# Patient Record
Sex: Male | Born: 1950 | Race: White | Hispanic: No | Marital: Married | Smoking: Never smoker
Health system: Southern US, Community
[De-identification: ages and names within clinical notes are randomized; demographics above are authoritative.]

## PROBLEM LIST (undated history)

## (undated) DIAGNOSIS — L570 Actinic keratosis: Secondary | ICD-10-CM

## (undated) HISTORY — PX: TOTAL HIP ARTHROPLASTY: SHX124

## (undated) HISTORY — PX: DG GALL BLADDER: HXRAD326

## (undated) HISTORY — PX: HERNIA REPAIR: SHX51

## (undated) HISTORY — DX: Actinic keratosis: L57.0

## (undated) HISTORY — PX: CATARACT EXTRACTION: SUR2

---

## 2004-01-30 ENCOUNTER — Emergency Department: Payer: Self-pay | Admitting: Emergency Medicine

## 2004-01-30 ENCOUNTER — Other Ambulatory Visit: Payer: Self-pay

## 2004-02-01 ENCOUNTER — Ambulatory Visit: Payer: Self-pay | Admitting: Emergency Medicine

## 2004-02-17 ENCOUNTER — Ambulatory Visit: Payer: Self-pay | Admitting: General Surgery

## 2004-03-01 ENCOUNTER — Ambulatory Visit: Payer: Self-pay | Admitting: General Surgery

## 2006-07-25 ENCOUNTER — Ambulatory Visit: Payer: Self-pay | Admitting: Ophthalmology

## 2007-03-06 ENCOUNTER — Ambulatory Visit: Payer: Self-pay | Admitting: Ophthalmology

## 2007-06-19 ENCOUNTER — Ambulatory Visit: Payer: Self-pay | Admitting: Internal Medicine

## 2007-06-21 ENCOUNTER — Ambulatory Visit: Payer: Self-pay | Admitting: Family Medicine

## 2010-01-12 ENCOUNTER — Ambulatory Visit: Payer: Self-pay | Admitting: General Practice

## 2010-01-26 ENCOUNTER — Inpatient Hospital Stay: Payer: Self-pay | Admitting: General Practice

## 2010-01-27 LAB — PATHOLOGY REPORT

## 2010-04-14 ENCOUNTER — Ambulatory Visit: Payer: Self-pay

## 2010-05-03 ENCOUNTER — Ambulatory Visit: Payer: Self-pay | Admitting: Orthopedic Surgery

## 2011-04-07 ENCOUNTER — Ambulatory Visit: Payer: Self-pay | Admitting: Unknown Physician Specialty

## 2012-03-20 IMAGING — CR DG HIP COMPLETE 2+V*L*
1 series · 2 of 2 positions shown · non-contrast
Comparison: none

REASON FOR EXAM: s/p THA
COMMENTS:   Bedside (portable):Y

PROCEDURE:     DXR - DXR HIP LEFT COMPLETE  - January 26, 2010 [DATE]
RESULT:     The patient is status post left hip replacement. No fracture
about the femoral or acetabular prosthetic components is seen. There is no
dislocation at the prosthetic hip joint.

[Series 1: view not recorded · 0.17mm/px · 2 of 2 slices shown]
[im 1/2]
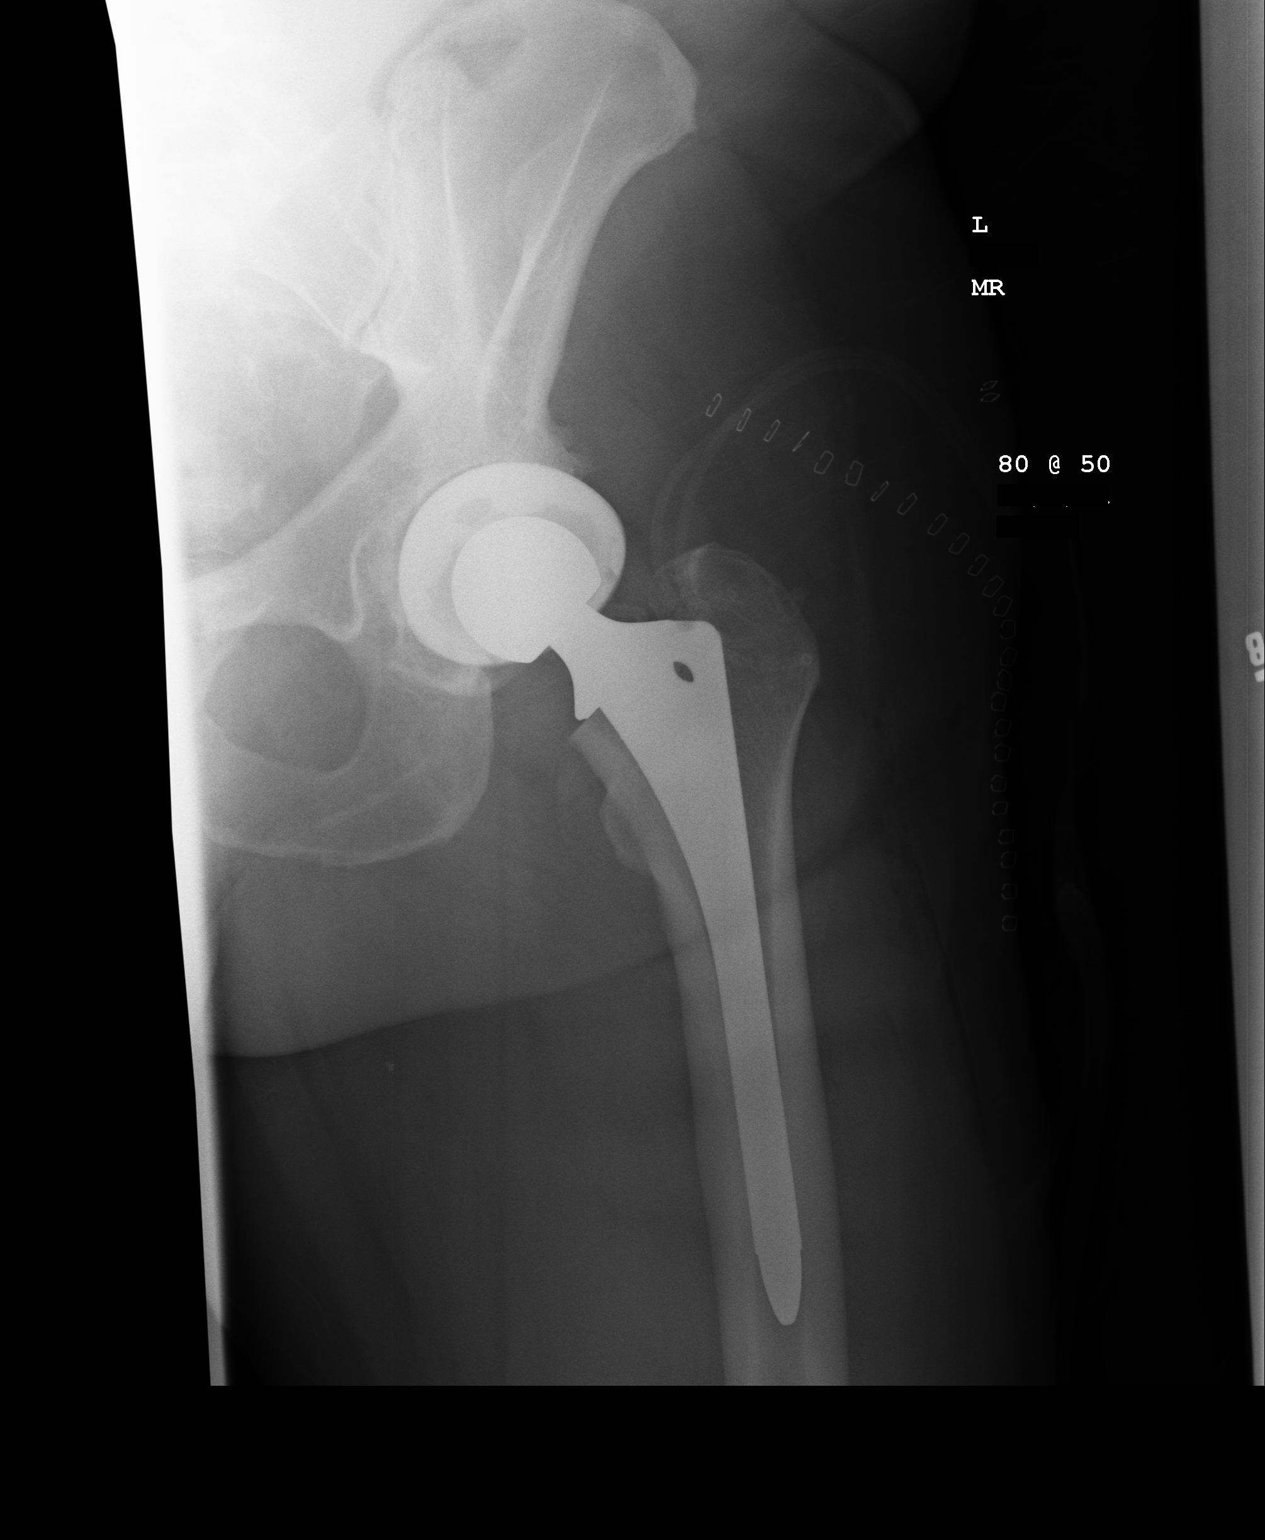
[im 2/2]
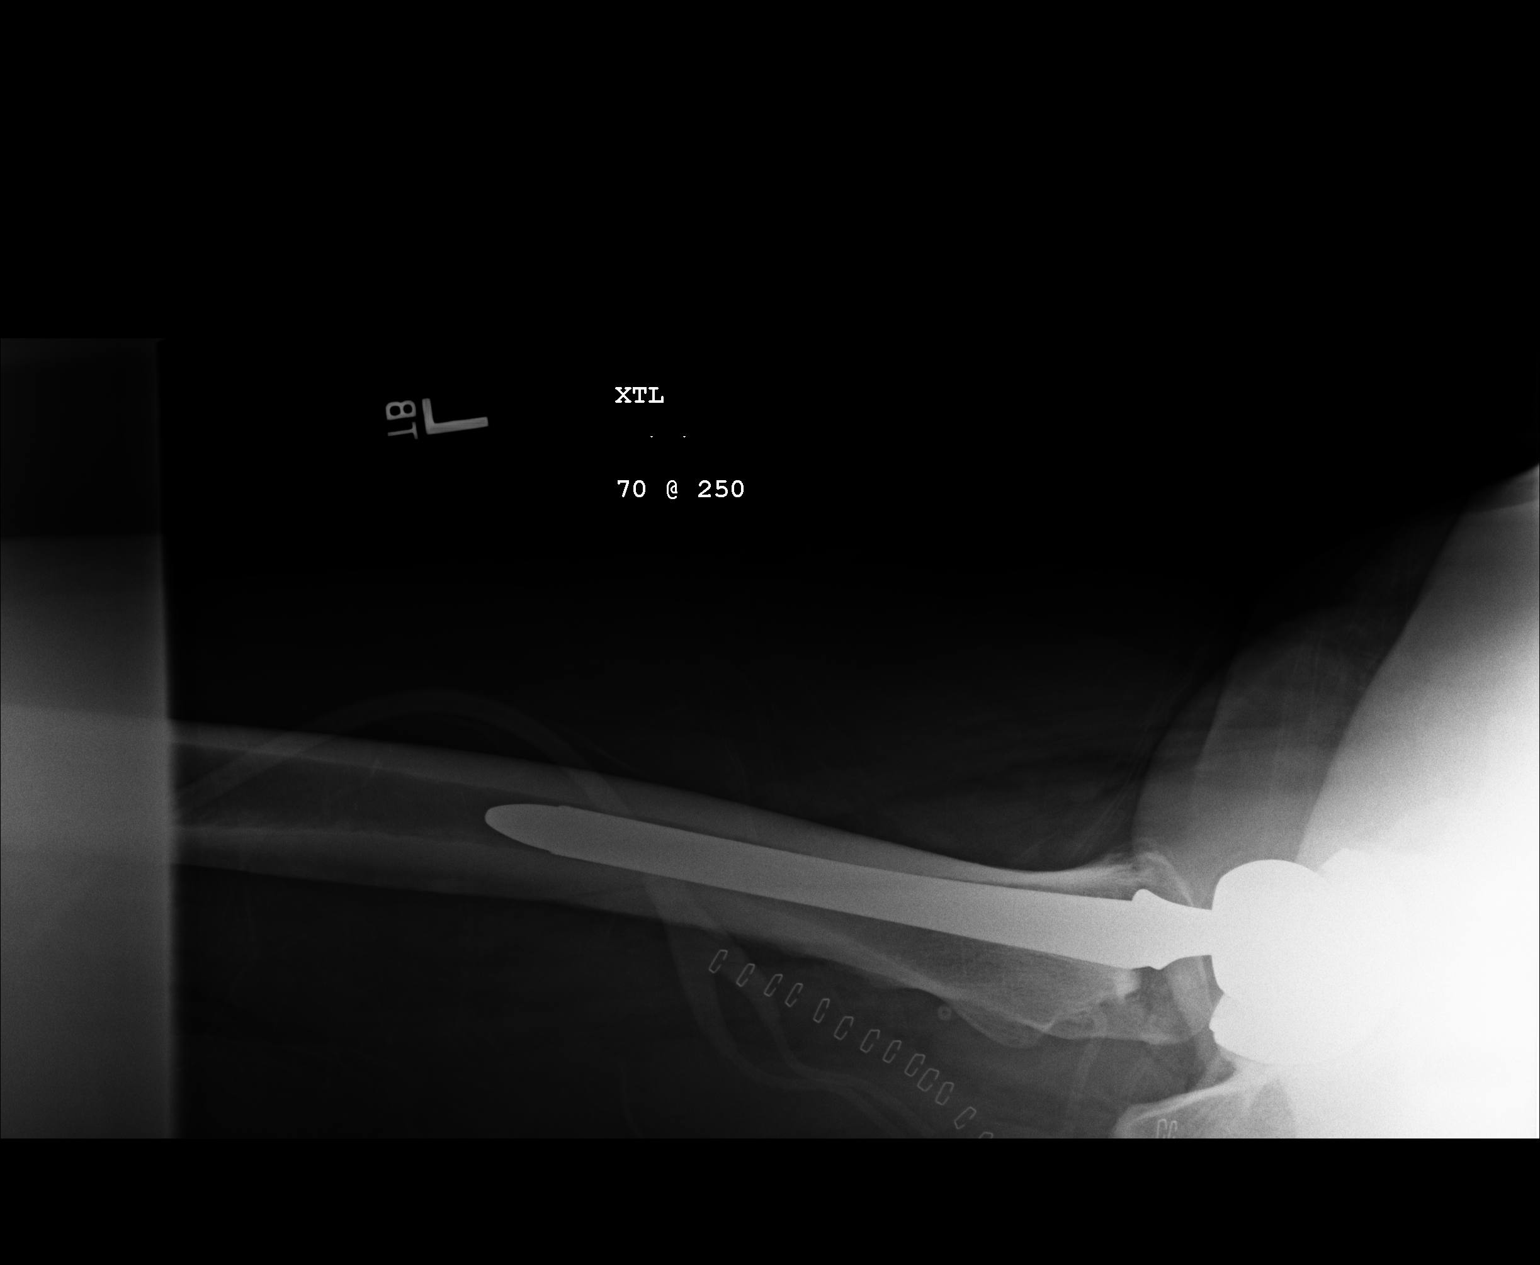

[2 of 2 positions shown; findings below may reference images not displayed]

IMPRESSION: 1.     The patient is status post left hip replacement. No abnormal
postoperative changes are identified.

## 2012-10-10 DIAGNOSIS — Z9109 Other allergy status, other than to drugs and biological substances: Secondary | ICD-10-CM | POA: Insufficient documentation

## 2014-01-19 DIAGNOSIS — Z9049 Acquired absence of other specified parts of digestive tract: Secondary | ICD-10-CM | POA: Insufficient documentation

## 2014-01-19 DIAGNOSIS — Z9089 Acquired absence of other organs: Secondary | ICD-10-CM | POA: Insufficient documentation

## 2014-02-17 ENCOUNTER — Ambulatory Visit: Payer: Self-pay | Admitting: Surgery

## 2014-02-17 LAB — CBC WITH DIFFERENTIAL/PLATELET
Basophil #: 0 10*3/uL (ref 0.0–0.1)
Basophil %: 1 %
EOS PCT: 3.1 %
Eosinophil #: 0.1 10*3/uL (ref 0.0–0.7)
HCT: 43.3 % (ref 40.0–52.0)
HGB: 14.6 g/dL (ref 13.0–18.0)
Lymphocyte #: 1.7 10*3/uL (ref 1.0–3.6)
Lymphocyte %: 37.2 %
MCH: 34 pg (ref 26.0–34.0)
MCHC: 33.8 g/dL (ref 32.0–36.0)
MCV: 101 fL — ABNORMAL HIGH (ref 80–100)
Monocyte #: 0.3 x10 3/mm (ref 0.2–1.0)
Monocyte %: 6.2 %
Neutrophil #: 2.4 10*3/uL (ref 1.4–6.5)
Neutrophil %: 52.5 %
PLATELETS: 226 10*3/uL (ref 150–440)
RBC: 4.3 10*6/uL — ABNORMAL LOW (ref 4.40–5.90)
RDW: 12.8 % (ref 11.5–14.5)
WBC: 4.6 10*3/uL (ref 3.8–10.6)

## 2014-02-17 LAB — COMPREHENSIVE METABOLIC PANEL
ALK PHOS: 70 U/L
ANION GAP: 8 (ref 7–16)
Albumin: 3.8 g/dL (ref 3.4–5.0)
BILIRUBIN TOTAL: 0.6 mg/dL (ref 0.2–1.0)
BUN: 14 mg/dL (ref 7–18)
CHLORIDE: 106 mmol/L (ref 98–107)
CO2: 28 mmol/L (ref 21–32)
Calcium, Total: 8.4 mg/dL — ABNORMAL LOW (ref 8.5–10.1)
Creatinine: 0.9 mg/dL (ref 0.60–1.30)
EGFR (African American): >60
EGFR (Non-African Amer.): >60
GLUCOSE: 104 mg/dL — AB (ref 65–99)
Osmolality: 284 (ref 275–301)
POTASSIUM: 3.7 mmol/L (ref 3.5–5.1)
SGOT(AST): 46 U/L — ABNORMAL HIGH (ref 15–37)
SGPT (ALT): 46 U/L
SODIUM: 142 mmol/L (ref 136–145)
TOTAL PROTEIN: 7.6 g/dL (ref 6.4–8.2)

## 2014-02-20 ENCOUNTER — Ambulatory Visit: Payer: Self-pay | Admitting: Surgery

## 2014-08-15 NOTE — Op Note (Signed)
PATIENT NAME:  Juan Rivera, Juan Rivera MR#:  [id number] DATE OF BIRTH:  1950/06/11  DATE OF PROCEDURE:  02/20/2014  PREOPERATIVE DIAGNOSIS: Left inguinal hernia.   POSTOPERATIVE DIAGNOSIS: Left inguinal hernia.   PROCEDURE: Left inguinal hernia repair.   SURGEON: Rochel Brome, MD  ANESTHESIA: General.   INDICATIONS: This 63 year old male reports bulging in the left groin. A left inguinal hernia was demonstrated on physical exam and repair was recommended for definitive treatment.   DESCRIPTION OF PROCEDURE: The patient was placed on the operating table in the supine position under general anesthesia. The left lower quadrant was clipped and prepared with ChloraPrep and draped in a sterile manner.   A left lower quadrant transversely oriented suprapubic incision was made, carried down through subcutaneous tissues. Numerous traversing veins were divided between 4-0 chromic suture ligatures. The Scarpa's fascia was incised. Numerous small bleeding points were cauterized. The external oblique aponeurosis was incised along the course of its fibers to open the external ring and expose the inguinal cord structures. The external oblique was retracted with a Soil scientist. The cord structures were mobilized. There was some mild weakness of the floor of the inguinal canal. The cremaster fibers were spread to expose an indirect hernia sac which was dissected free from surrounding structures. The sac was some 7 cm in length. It was opened. Its continuity with the peritoneal cavity was demonstrated. There was no bowel within the sac. A high ligation of the sac was done with a 4-0 Vicryl suture ligature. The sac was excised and was not sent for pathology. The stump was allowed to retract. Next, the floor of the inguinal canal was repaired with a row of 0 Surgilon sutures suturing the conjoined tendon to the shelving edge of the inguinal ligament incorporating transversalis fascia into the repair. The last stitch led  to satisfactory narrowing of the internal ring. Next, an onlay Bard soft mesh was cut to create an oval shape of some 2.8 x 3.5 cm. A notch was cut out to straddle the internal ring. The mesh was placed along the floor of the inguinal canal. A relaxing incision was made medially so that the mesh would overlap. The mesh was sutured to the repair with 0 Surgilon, also sutured on both sides of the internal ring and sutured medially to the medial side of the relaxing incision. Hemostasis was intact. The cut edges of the external oblique aponeurosis were closed with a running 4-0 Vicryl. The deep fascia superior and lateral to the repair site was infiltrated with 0.5% Sensorcaine with epinephrine. Subcutaneous tissues were infiltrated using a total of 15 mL. Next, the Scarpa's fascia was closed with interrupted 4-0 Vicryl. Hemostasis was intact. The skin was closed with running 4-0 Monocryl subcuticular suture and Dermabond. The testicle remained in the scrotum. The patient was prepared for transfer to the recovery room.  ____________________________ Lenna Sciara. Rochel Brome, MD jws:sb D: 02/20/2014 10:31:58 ET T: 02/20/2014 12:06:52 ET JOB#: [id number]  cc: Loreli Dollar, MD, <Dictator> Loreli Dollar MD ELECTRONICALLY SIGNED 02/20/2014 19:37

## 2014-10-18 DIAGNOSIS — Z96642 Presence of left artificial hip joint: Secondary | ICD-10-CM | POA: Insufficient documentation

## 2016-03-03 DIAGNOSIS — N529 Male erectile dysfunction, unspecified: Secondary | ICD-10-CM | POA: Insufficient documentation

## 2016-04-20 ENCOUNTER — Other Ambulatory Visit: Payer: Self-pay | Admitting: Orthopedic Surgery

## 2016-04-20 DIAGNOSIS — G8929 Other chronic pain: Secondary | ICD-10-CM

## 2016-04-20 DIAGNOSIS — M1611 Unilateral primary osteoarthritis, right hip: Secondary | ICD-10-CM

## 2016-04-20 DIAGNOSIS — M25551 Pain in right hip: Secondary | ICD-10-CM

## 2016-04-21 ENCOUNTER — Ambulatory Visit: Payer: Managed Care, Other (non HMO)

## 2016-04-22 ENCOUNTER — Ambulatory Visit: Payer: Managed Care, Other (non HMO)

## 2016-06-07 ENCOUNTER — Encounter (INDEPENDENT_AMBULATORY_CARE_PROVIDER_SITE_OTHER): Payer: Self-pay

## 2016-06-07 ENCOUNTER — Ambulatory Visit
Admission: RE | Admit: 2016-06-07 | Discharge: 2016-06-07 | Disposition: A | Discharge status: Home / Self Care | Payer: Managed Care, Other (non HMO) | Source: Ambulatory Visit | Attending: Family Medicine | Admitting: Family Medicine

## 2016-06-07 ENCOUNTER — Other Ambulatory Visit: Payer: Self-pay | Admitting: Family Medicine

## 2016-06-07 DIAGNOSIS — R05 Cough: Secondary | ICD-10-CM

## 2016-06-07 DIAGNOSIS — R059 Cough, unspecified: Secondary | ICD-10-CM

## 2016-11-08 DIAGNOSIS — N138 Other obstructive and reflux uropathy: Secondary | ICD-10-CM | POA: Insufficient documentation

## 2016-11-08 DIAGNOSIS — N3020 Other chronic cystitis without hematuria: Secondary | ICD-10-CM | POA: Insufficient documentation

## 2016-11-08 DIAGNOSIS — R339 Retention of urine, unspecified: Secondary | ICD-10-CM | POA: Insufficient documentation

## 2016-11-08 DIAGNOSIS — Q631 Lobulated, fused and horseshoe kidney: Secondary | ICD-10-CM | POA: Insufficient documentation

## 2018-07-31 IMAGING — CR DG CHEST 2V
3 series · 3 of 3 positions shown · non-contrast
Comparison: None.

CLINICAL DATA: 65 y/o  M; cough.

EXAM:
CHEST  2 VIEW

[chest pa (1 of 2)]
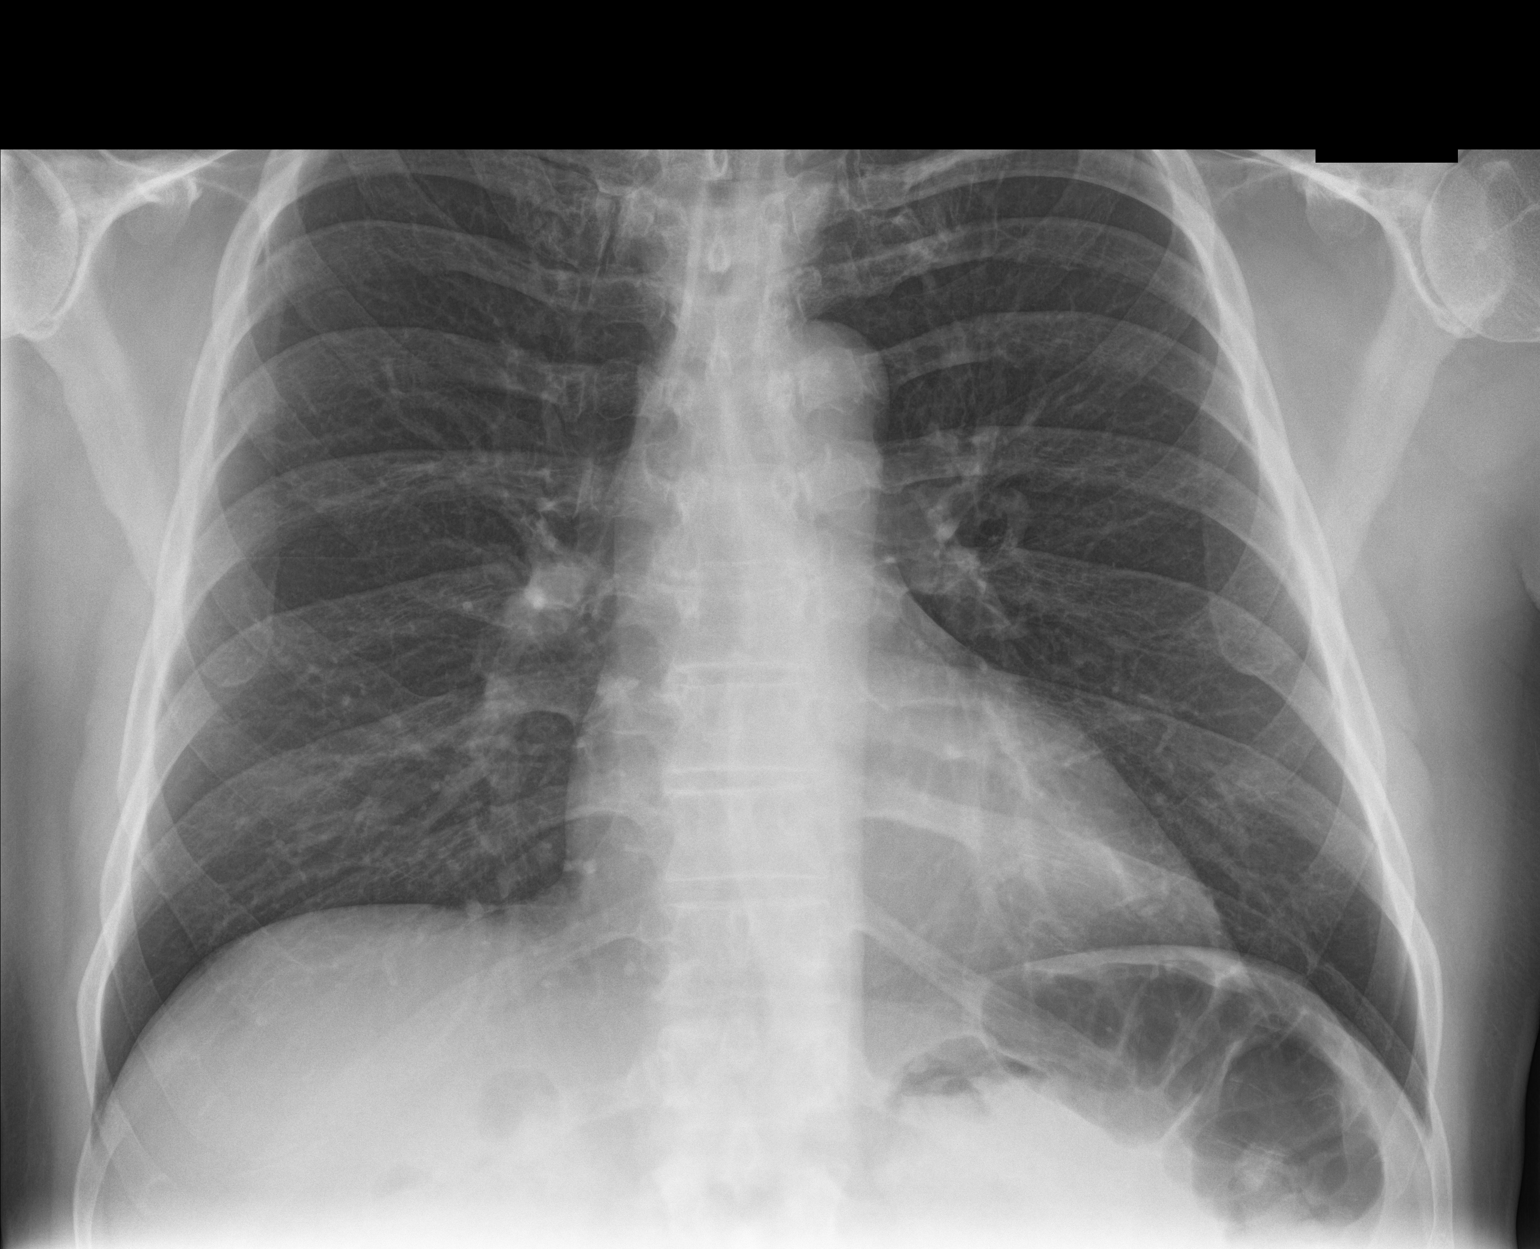

[chest lat]
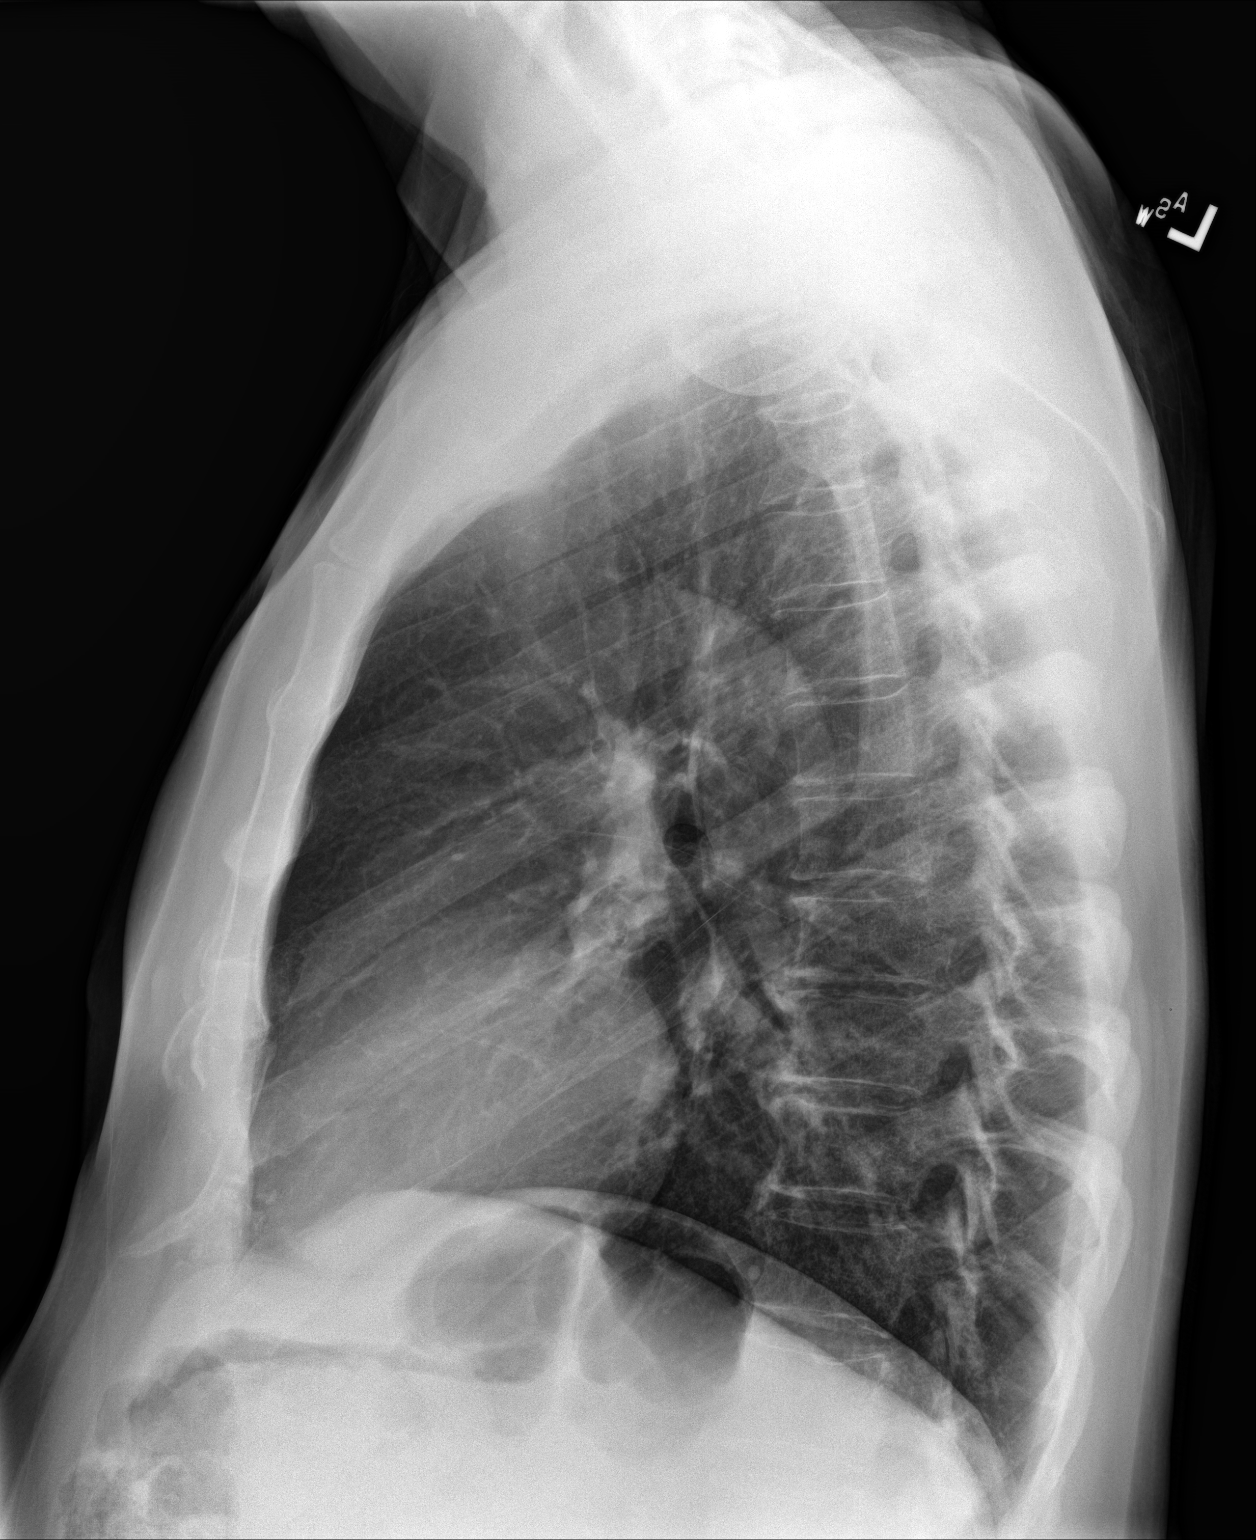

[chest pa (2 of 2)]
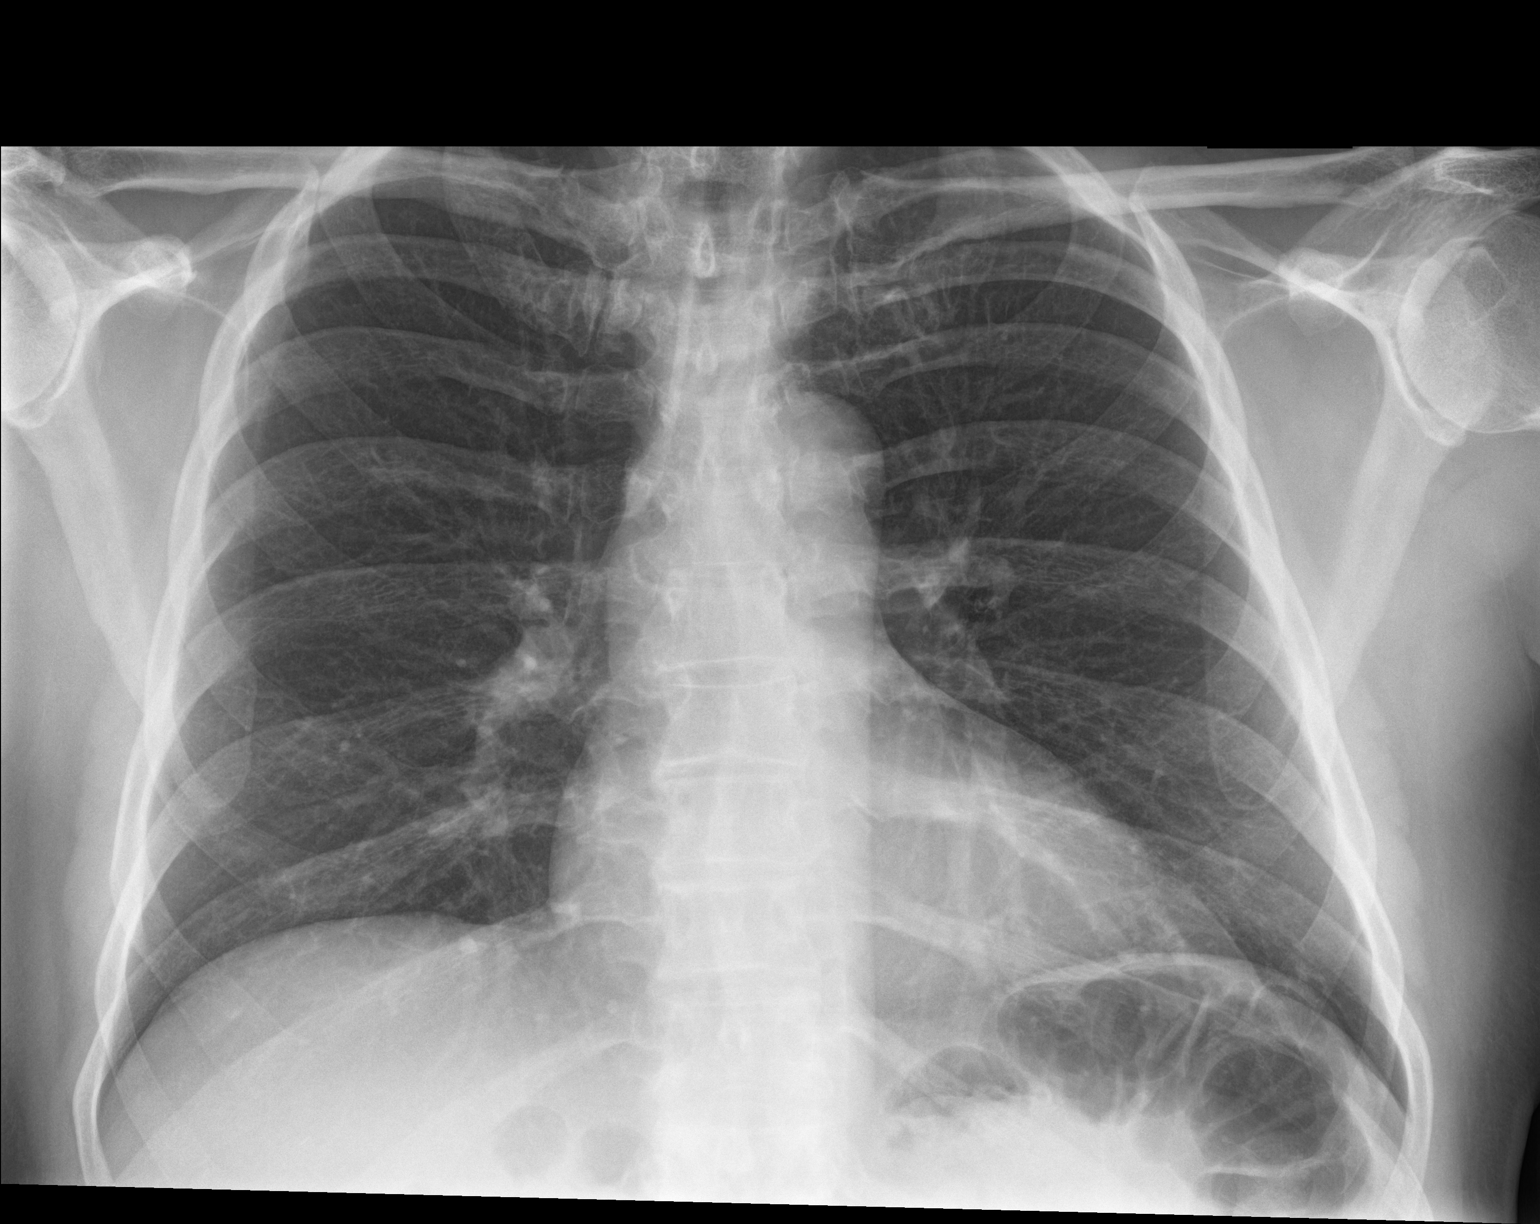

[3 of 3 positions shown; findings below may reference images not displayed]

FINDINGS: The heart size and mediastinal contours are within normal limits.
Both lungs are clear. Mild degenerative changes of the spine.
IMPRESSION: No active cardiopulmonary disease.

By: Kensho Makhloufi M.D.

## 2018-11-19 DIAGNOSIS — K4091 Unilateral inguinal hernia, without obstruction or gangrene, recurrent: Secondary | ICD-10-CM | POA: Insufficient documentation

## 2019-04-03 ENCOUNTER — Other Ambulatory Visit: Payer: Self-pay

## 2019-04-03 DIAGNOSIS — Z20822 Contact with and (suspected) exposure to covid-19: Secondary | ICD-10-CM

## 2019-04-05 LAB — NOVEL CORONAVIRUS, NAA: SARS-CoV-2, NAA: DETECTED — AB

## 2019-04-06 ENCOUNTER — Telehealth: Payer: Self-pay | Admitting: Unknown Physician Specialty

## 2019-04-06 NOTE — Telephone Encounter (Signed)
Called to discuss with patient about Covid symptoms and the use of bamlanivimab, a monoclonal antibody infusion for those with mild to moderate Covid symptoms and at a high risk of hospitalization.  Pt is qualified for this infusion at the [facility] infusion center due to Age > 65   Left message to call back

## 2019-04-07 ENCOUNTER — Telehealth: Payer: Self-pay | Admitting: Unknown Physician Specialty

## 2019-04-07 ENCOUNTER — Other Ambulatory Visit: Payer: Self-pay | Admitting: Unknown Physician Specialty

## 2019-04-07 DIAGNOSIS — U071 COVID-19: Secondary | ICD-10-CM

## 2019-04-07 NOTE — Telephone Encounter (Signed)
  I connected by phone with Juan Rivera on 04/07/2019 at 10:04 AM to discuss the potential use of an new treatment for mild to moderate COVID-19 viral infection in non-hospitalized patients.  This patient is a 68 y.o. male that meets the FDA criteria for Emergency Use Authorization of bamlanivimab or casirivimab\imdevimab.  Has a (+) direct SARS-CoV-2 viral test result  Has mild or moderate COVID-19   Is ? 68 years of age and weighs ? 40 kg  Is NOT hospitalized due to COVID-19  Is NOT requiring oxygen therapy or requiring an increase in baseline oxygen flow rate due to COVID-19  Is within 10 days of symptom onset  Has at least one of the high risk factor(s) for progression to severe COVID-19 and/or hospitalization as defined in EUA.  Specific high risk criteria : >/= 68 yo   I have spoken and communicated the following to the patient or parent/caregiver:  1. FDA has authorized the emergency use of bamlanivimab and casirivimab\imdevimab for the treatment of mild to moderate COVID-19 in adults and pediatric patients with positive results of direct SARS-CoV-2 viral testing who are 53 years of age and older weighing at least 40 kg, and who are at high risk for progressing to severe COVID-19 and/or hospitalization.  2. The significant known and potential risks and benefits of bamlanivimab and casirivimab\imdevimab, and the extent to which such potential risks and benefits are unknown.  3. Information on available alternative treatments and the risks and benefits of those alternatives, including clinical trials.  4. Patients treated with bamlanivimab and casirivimab\imdevimab should continue to self-isolate and use infection control measures (e.g., wear mask, isolate, social distance, avoid sharing personal items, clean and disinfect "high touch" surfaces, and frequent handwashing) according to CDC guidelines.   5. The patient or parent/caregiver has the option to accept or refuse  bamlanivimab or casirivimab\imdevimab .  After reviewing this information with the patient, The patient agreed to proceed with receiving the bamlanimivab infusion and will be provided a copy of the Fact sheet prior to receiving the infusion.Kathrine Haddock 04/07/2019 10:04 AM

## 2019-04-09 ENCOUNTER — Encounter (HOSPITAL_COMMUNITY): Payer: Self-pay

## 2019-04-09 ENCOUNTER — Ambulatory Visit (HOSPITAL_COMMUNITY)
Admission: RE | Admit: 2019-04-09 | Discharge: 2019-04-09 | Disposition: A | Discharge status: Home / Self Care | Payer: Medicare Other | Source: Ambulatory Visit | Attending: Pulmonary Disease | Admitting: Pulmonary Disease

## 2019-04-09 DIAGNOSIS — Z23 Encounter for immunization: Secondary | ICD-10-CM | POA: Insufficient documentation

## 2019-04-09 DIAGNOSIS — U071 COVID-19: Secondary | ICD-10-CM | POA: Diagnosis not present

## 2019-04-09 MED ORDER — EPINEPHRINE 0.3 MG/0.3ML IJ SOAJ: 0.3000 mg | Freq: Once | INTRAMUSCULAR | Status: DC | PRN

## 2019-04-09 MED ORDER — SODIUM CHLORIDE 0.9 % IV SOLN
INTRAVENOUS | Status: DC | PRN
  Administered 2019-04-09: 250 mL via INTRAVENOUS

## 2019-04-09 MED ORDER — DIPHENHYDRAMINE HCL 50 MG/ML IJ SOLN: 50.0000 mg | Freq: Once | INTRAMUSCULAR | Status: DC | PRN

## 2019-04-09 MED ORDER — FAMOTIDINE IN NACL 20-0.9 MG/50ML-% IV SOLN: 20.0000 mg | Freq: Once | INTRAVENOUS | Status: DC | PRN

## 2019-04-09 MED ORDER — ALBUTEROL SULFATE HFA 108 (90 BASE) MCG/ACT IN AERS: 2.0000 | INHALATION_SPRAY | Freq: Once | RESPIRATORY_TRACT | Status: DC | PRN

## 2019-04-09 MED ORDER — SODIUM CHLORIDE 0.9 % IV SOLN
700.0000 mg | Freq: Once | INTRAVENOUS | Status: AC
  Administered 2019-04-09: 15:00:00 700 mg via INTRAVENOUS
  Filled 2019-04-09: qty 20

## 2019-04-09 MED ORDER — METHYLPREDNISOLONE SODIUM SUCC 125 MG IJ SOLR: 125.0000 mg | Freq: Once | INTRAMUSCULAR | Status: DC | PRN

## 2019-04-09 NOTE — Progress Notes (Signed)
  Diagnosis: COVID-19  Physician: Dr. Joya Gaskins  Procedure: [facility] Med: bamlanivimab infusion - Provided patient with bamlanimivab fact sheet for patients, parents and caregivers prior to infusion.  Complications: No immediate complications noted.  Discharge: Discharged home   Juan Rivera, Juan Rivera 04/09/2019

## 2019-04-09 NOTE — Discharge Instructions (Signed)
Prevent the Spread of COVID-19 if You Are Sick If you are sick with COVID-19 or think you might have COVID-19, follow the steps below to help protect other people in your home and community. Stay home except to get medical care.  Stay home. Most people with COVID-19 have mild illness and are able to recover at home without medical care. Do not leave your home, except to get medical care. Do not visit public areas.  Take care of yourself. Get rest and stay hydrated.  Get medical care when needed. Call your doctor before you go to their office for care. But, if you have trouble breathing or other concerning symptoms, call 911 for immediate help.  Avoid public transportation, ride-sharing, or taxis. Separate yourself from other people and pets in your home.  As much as possible, stay in a specific room and away from other people and pets in your home. Also, you should use a separate bathroom, if available. If you need to be around other people or animals in or outside of the home, wear a cloth face covering. ? See COVID-19 and Animals if you have questions about pets: https://www.cdc.gov/coronavirus/2019-ncov/faq.html#COVID19animals Monitor your symptoms.  Common symptoms of COVID-19 include fever and cough. Trouble breathing is a more serious symptom that means you should get medical attention.  Follow care instructions from your healthcare provider and local health department. Your local health authorities will give instructions on checking your symptoms and reporting information. If you develop emergency warning signs for COVID-19 get medical attention immediately.  Emergency warning signs include*:  Trouble breathing  Persistent pain or pressure in the chest  New confusion or not able to be woken  Bluish lips or face *This list is not all inclusive. Please consult your medical provider for any other symptoms that are severe or concerning to you. Call 911 if you have a medical  emergency. If you have a medical emergency and need to call 911, notify the operator that you have or think you might have, COVID-19. If possible, put on a facemask before medical help arrives. Call ahead before visiting your doctor.  Call ahead. Many medical visits for routine care are being postponed or done by phone or telemedicine.  If you have a medical appointment that cannot be postponed, call your doctor's office. This will help the office protect themselves and other patients. If you are sick, wear a cloth covering over your nose and mouth.  You should wear a cloth face covering over your nose and mouth if you must be around other people or animals, including pets (even at home).  You don't need to wear the cloth face covering if you are alone. If you can't put on a cloth face covering (because of trouble breathing for example), cover your coughs and sneezes in some other way. Try to stay at least 6 feet away from other people. This will help protect the people around you. Note: During the COVID-19 pandemic, medical grade facemasks are reserved for healthcare workers and some first responders. You may need to make a cloth face covering using a scarf or bandana. Cover your coughs and sneezes.  Cover your mouth and nose with a tissue when you cough or sneeze.  Throw used tissues in a lined trash can.  Immediately wash your hands with soap and water for at least 20 seconds. If soap and water are not available, clean your hands with an alcohol-based hand sanitizer that contains at least 60% alcohol. Clean your hands often.    Wash your hands often with soap and water for at least 20 seconds. This is especially important after blowing your nose, coughing, or sneezing; going to the bathroom; and before eating or preparing food.  Use hand sanitizer if soap and water are not available. Use an alcohol-based hand sanitizer with at least 60% alcohol, covering all surfaces of your hands and rubbing  them together until they feel dry.  Soap and water are the best option, especially if your hands are visibly dirty.  Avoid touching your eyes, nose, and mouth with unwashed hands. Avoid sharing personal household items.  Do not share dishes, drinking glasses, cups, eating utensils, towels, or bedding with other people in your home.  Wash these items thoroughly after using them with soap and water or put them in the dishwasher. Clean all "high-touch" surfaces everyday.  Clean and disinfect high-touch surfaces in your "sick room" and bathroom. Let someone else clean and disinfect surfaces in common areas, but not your bedroom and bathroom.  If a caregiver or other person needs to clean and disinfect a sick person's bedroom or bathroom, they should do so on an as-needed basis. The caregiver/other person should wear a mask and wait as long as possible after the sick person has used the bathroom. High-touch surfaces include phones, remote controls, counters, tabletops, doorknobs, bathroom fixtures, toilets, keyboards, tablets, and bedside tables.  Clean and disinfect areas that may have blood, stool, or body fluids on them.  Use household cleaners and disinfectants. Clean the area or item with soap and water or another detergent if it is dirty. Then use a household disinfectant. ? Be sure to follow the instructions on the label to ensure safe and effective use of the product. Many products recommend keeping the surface wet for several minutes to ensure germs are killed. Many also recommend precautions such as wearing gloves and making sure you have good ventilation during use of the product. ? Most EPA-registered household disinfectants should be effective. How to discontinue home isolation  People with COVID-19 who have stayed home (home isolated) can stop home isolation under the following conditions: ? If you will not have a test to determine if you are still contagious, you can leave home  after these three things have happened:  You have had no fever for at least 72 hours (that is three full days of no fever without the use of medicine that reduces fevers) AND  other symptoms have improved (for example, when your cough or shortness of breath has improved) AND  at least 10 days have passed since your symptoms first appeared. ? If you will be tested to determine if you are still contagious, you can leave home after these three things have happened:  You no longer have a fever (without the use of medicine that reduces fevers) AND  other symptoms have improved (for example, when your cough or shortness of breath has improved) AND  you received two negative tests in a row, 24 hours apart. Your doctor will follow CDC guidelines. In all cases, follow the guidance of your healthcare provider and local health department. The decision to stop home isolation should be made in consultation with your healthcare provider and state and local health departments. Local decisions depend on local circumstances. cdc.gov/coronavirus 08/25/2018 This information is not intended to replace advice given to you by your health care provider. Make sure you discuss any questions you have with your health care provider. Document Released: 08/06/2018 Document Revised: 09/04/2018 Document Reviewed: 08/06/2018   Elsevier Patient Education  2020 [employer]  

## 2020-06-09 DIAGNOSIS — H9192 Unspecified hearing loss, left ear: Secondary | ICD-10-CM | POA: Insufficient documentation

## 2020-06-09 DIAGNOSIS — G8929 Other chronic pain: Secondary | ICD-10-CM | POA: Insufficient documentation

## 2020-12-08 ENCOUNTER — Ambulatory Visit: Payer: Medicare HMO | Admitting: Dermatology

## 2020-12-08 ENCOUNTER — Other Ambulatory Visit: Payer: Self-pay

## 2020-12-08 DIAGNOSIS — Z872 Personal history of diseases of the skin and subcutaneous tissue: Secondary | ICD-10-CM | POA: Diagnosis not present

## 2020-12-08 DIAGNOSIS — L219 Seborrheic dermatitis, unspecified: Secondary | ICD-10-CM

## 2020-12-08 DIAGNOSIS — D229 Melanocytic nevi, unspecified: Secondary | ICD-10-CM

## 2020-12-08 DIAGNOSIS — L570 Actinic keratosis: Secondary | ICD-10-CM | POA: Diagnosis not present

## 2020-12-08 DIAGNOSIS — L578 Other skin changes due to chronic exposure to nonionizing radiation: Secondary | ICD-10-CM | POA: Diagnosis not present

## 2020-12-08 DIAGNOSIS — Z1283 Encounter for screening for malignant neoplasm of skin: Secondary | ICD-10-CM | POA: Diagnosis not present

## 2020-12-08 DIAGNOSIS — D1800 Hemangioma unspecified site: Secondary | ICD-10-CM

## 2020-12-08 DIAGNOSIS — L814 Other melanin hyperpigmentation: Secondary | ICD-10-CM

## 2020-12-08 DIAGNOSIS — L821 Other seborrheic keratosis: Secondary | ICD-10-CM

## 2020-12-08 MED ORDER — HYDROCORTISONE 2.5 % EX CREA: TOPICAL_CREAM | Freq: Two times a day (BID) | CUTANEOUS | 11 refills | Status: DC | PRN

## 2020-12-08 MED ORDER — MOMETASONE FUROATE 0.1 % EX SOLN: CUTANEOUS | 11 refills | Status: DC

## 2020-12-08 NOTE — Progress Notes (Signed)
New Patient Visit  Subjective  Juan Rivera is a 70 y.o. male who presents for the following: Annual Exam (Mole check ). Yearly mole check hx of Aks.  The patient presents for Total-Body Skin Exam (TBSE) for skin cancer screening and mole check.   The following portions of the chart were reviewed this encounter and updated as appropriate:   Allergies  Meds  Problems  Med Hx  Surg Hx  Fam Hx     Review of Systems:  No other skin or systemic complaints except as noted in HPI or Assessment and Plan.  Objective  Well appearing patient in no apparent distress; mood and affect are within normal limits.  A full examination was performed including scalp, head, eyes, ears, nose, lips, neck, chest, axillae, abdomen, back, buttocks, bilateral upper extremities, bilateral lower extremities, hands, feet, fingers, toes, fingernails, and toenails. All findings within normal limits unless otherwise noted below.  right ear superior helix Erythematous thin papules/macules with gritty scale.   Scalp, ears Pink patches with greasy scale.    Assessment & Plan  AK (actinic keratosis) right ear superior helix  Actinic keratoses are precancerous spots that appear secondary to cumulative UV radiation exposure/sun exposure over time. They are chronic with expected duration over 1 year. A portion of actinic keratoses will progress to squamous cell carcinoma of the skin. It is not possible to reliably predict which spots will progress to skin cancer and so treatment is recommended to prevent development of skin cancer.  Recommend daily broad spectrum sunscreen SPF 30+ to sun-exposed areas, reapply every 2 hours as needed.  Recommend staying in the shade or wearing long sleeves, sun glasses (UVA+UVB protection) and wide brim hats (4-inch brim around the entire circumference of the hat). Call for new or changing lesions.   Destruction of lesion - right ear superior helix Complexity: simple    Destruction method: cryotherapy   Informed consent: discussed and consent obtained   Timeout:  patient name, date of birth, surgical site, and procedure verified Lesion destroyed using liquid nitrogen: Yes   Region frozen until ice ball extended beyond lesion: Yes   Outcome: patient tolerated procedure well with no complications   Post-procedure details: wound care instructions given    Seborrheic dermatitis Scalp, ears  Seborrheic Dermatitis  -  is a chronic persistent rash characterized by pinkness and scaling most commonly of the mid face but also can occur on the scalp (dandruff), ears; mid chest and mid back. It tends to be exacerbated by stress and cooler weather.  People who have neurologic disease may experience new onset or exacerbation of existing seborrheic dermatitis.  The condition is not curable but treatable and can be controlled.   Cont Mometasone solution apply to scalp as directed  Cont Hydrocortisone 2.5% cream apply to ears bid prn irritation   Related Medications mometasone (ELOCON) 0.1 % lotion Apply to the scalp twice a day prn itching  hydrocortisone 2.5 % cream Apply topically 2 (two) times daily as needed (Rash).  Skin cancer screening  Lentigines - Scattered tan macules - Due to sun exposure - Benign-appering, observe - Recommend daily broad spectrum sunscreen SPF 30+ to sun-exposed areas, reapply every 2 hours as needed. - Call for any changes  Seborrheic Keratoses - Stuck-on, waxy, tan-brown papules and/or plaques  - Benign-appearing - Discussed benign etiology and prognosis. - Observe - Call for any changes  Melanocytic Nevi - Tan-brown and/or pink-flesh-colored symmetric macules and papules - Benign appearing on exam  today - Observation - Call clinic for new or changing moles - Recommend daily use of broad spectrum spf 30+ sunscreen to sun-exposed areas.   Hemangiomas - Red papules - Discussed benign nature - Observe - Call for any  changes  Actinic Damage - Chronic condition, secondary to cumulative UV/sun exposure - diffuse scaly erythematous macules with underlying dyspigmentation - Recommend daily broad spectrum sunscreen SPF 30+ to sun-exposed areas, reapply every 2 hours as needed.  - Staying in the shade or wearing long sleeves, sun glasses (UVA+UVB protection) and wide brim hats (4-inch brim around the entire circumference of the hat) are also recommended for sun protection.  - Call for new or changing lesions.  Skin cancer screening performed today.   Follow-up 1 year  I, Marye Round, CMA, am acting as scribe for Sarina Ser, MD .  Documentation: I have reviewed the above documentation for accuracy and completeness, and I agree with the above.  Sarina Ser, MD

## 2020-12-08 NOTE — Patient Instructions (Addendum)

## 2020-12-12 ENCOUNTER — Encounter: Payer: Self-pay | Admitting: Dermatology

## 2021-07-11 ENCOUNTER — Other Ambulatory Visit: Payer: Self-pay

## 2021-07-11 ENCOUNTER — Ambulatory Visit
Admission: EM | Admit: 2021-07-11 | Discharge: 2021-07-11 | Disposition: A | Discharge status: Home / Self Care | Payer: Medicare HMO | Attending: Emergency Medicine | Admitting: Emergency Medicine

## 2021-07-11 DIAGNOSIS — N3000 Acute cystitis without hematuria: Secondary | ICD-10-CM | POA: Insufficient documentation

## 2021-07-11 LAB — URINALYSIS, ROUTINE W REFLEX MICROSCOPIC
Bilirubin Urine: NEGATIVE
Glucose, UA: NEGATIVE mg/dL
Ketones, ur: NEGATIVE mg/dL
Nitrite: POSITIVE — AB
Protein, ur: NEGATIVE mg/dL
Specific Gravity, Urine: 1.015 (ref 1.005–1.030)
pH: 6 (ref 5.0–8.0)

## 2021-07-11 LAB — URINALYSIS, MICROSCOPIC (REFLEX)

## 2021-07-11 MED ORDER — PHENAZOPYRIDINE HCL 200 MG PO TABS: 200.0000 mg | ORAL_TABLET | Freq: Three times a day (TID) | ORAL | 0 refills | Status: DC

## 2021-07-11 MED ORDER — CIPROFLOXACIN HCL 500 MG PO TABS: 500.0000 mg | ORAL_TABLET | Freq: Two times a day (BID) | ORAL | 0 refills | Status: AC

## 2021-07-11 NOTE — ED Provider Notes (Signed)
?[place] ? ? ? ?CSN: [id number] ?Arrival date & time: 07/11/21  1800 ? ? ?  ? ?History   ?Chief Complaint ?Chief Complaint  ?Patient presents with  ? Dysuria  ? ? ?HPI ?Juan Rivera is a 71 y.o. male.  ? ?HPI ? ?45-year-old male here for evaluation of urinary complaints. ? ?Patient reports that he developed cloudy urine and painful urination this afternoon along with some urgency and frequency of urination.  He denies any fever, abdominal pain, or low back pain.  He also denies any blood in his urine.  He was recently treated at an urgent care in [facility] on 06/29/2021 for UTI with a 7-day course of Cipro.  The culture at that time grew out E. coli.  Patient reports that the last several times she has had UTIs the only antibiotic that has been effective has been a fluoroquinolone. ? ?History reviewed. No pertinent past medical history. ? ?There are no problems to display for this patient. ? ? ?Past Surgical History:  ?Procedure Laterality Date  ? CATARACT EXTRACTION    ? DG GALL BLADDER    ? HERNIA REPAIR    ? TOTAL HIP ARTHROPLASTY    ? ? ? ? ? ?Home Medications   ? ?Prior to Admission medications   ?Medication Sig Start Date End Date Taking? Authorizing Provider  ?ciprofloxacin (CIPRO) 500 MG tablet Take 1 tablet (500 mg total) by mouth 2 (two) times daily for 10 days. 07/11/21 07/21/21 Yes Margarette Canada, NP  ?phenazopyridine (PYRIDIUM) 200 MG tablet Take 1 tablet (200 mg total) by mouth 3 (three) times daily. 07/11/21  Yes Margarette Canada, NP  ?hydrocortisone 2.5 % cream Apply topically 2 (two) times daily as needed (Rash). 12/08/20   Ralene Bathe, MD  ?mometasone (ELOCON) 0.1 % lotion Apply to the scalp twice a day prn itching 12/08/20   Ralene Bathe, MD  ? ? ?Family History ?History reviewed. No pertinent family history. ? ?Social History ?Social History  ? ?Tobacco Use  ? Smoking status: Never  ? Smokeless tobacco: Never  ?Vaping Use  ? Vaping Use: Never used  ?Substance Use Topics  ?  Alcohol use: Yes  ? Drug use: Never  ? ? ? ?Allergies   ?Almond oil, Lactose, and Peanut-containing drug products ? ? ?Review of Systems ?Review of Systems  ?Constitutional:  Negative for fever.  ?Gastrointestinal:  Negative for abdominal pain.  ?Genitourinary:  Positive for dysuria, frequency and urgency. Negative for hematuria.  ?Musculoskeletal:  Negative for back pain.  ? ? ?Physical Exam ?Triage Vital Signs ?ED Triage Vitals  ?Enc Vitals Group  ?   BP 07/11/21 1858 (!) 147/87  ?   Pulse Rate 07/11/21 1858 70  ?   Resp 07/11/21 1858 16  ?   Temp 07/11/21 1858 98.7 ?F (37.1 ?C)  ?   Temp Source 07/11/21 1858 Oral  ?   SpO2 07/11/21 1858 98 %  ?   Weight --   ?   Height --   ?   Head Circumference --   ?   Peak Flow --   ?   Pain Score 07/11/21 1856 2  ?   Pain Loc --   ?   Pain Edu? --   ?   Excl. in [place]? --   ? ?No data found. ? ?Updated Vital Signs ?BP (!) 147/87 (BP Location: Right Arm)   Pulse 70   Temp 98.7 ?F (37.1 ?C) (Oral)   Resp  16   SpO2 98%  ? ?Visual Acuity ?Right Eye Distance:   ?Left Eye Distance:   ?Bilateral Distance:   ? ?Right Eye Near:   ?Left Eye Near:    ?Bilateral Near:    ? ?Physical Exam ?Vitals and nursing note reviewed.  ?Constitutional:   ?   Appearance: Normal appearance. He is not ill-appearing.  ?HENT:  ?   Head: Normocephalic and atraumatic.  ?Cardiovascular:  ?   Rate and Rhythm: Normal rate and regular rhythm.  ?   Pulses: Normal pulses.  ?   Heart sounds: Normal heart sounds. No murmur heard. ?  No friction rub. No gallop.  ?Pulmonary:  ?   Effort: Pulmonary effort is normal.  ?   Breath sounds: Normal breath sounds. No wheezing, rhonchi or rales.  ?Abdominal:  ?   Tenderness: There is no right CVA tenderness or left CVA tenderness.  ?Skin: ?   General: Skin is warm and dry.  ?   Capillary Refill: Capillary refill takes less than 2 seconds.  ?   Findings: No erythema or rash.  ?Neurological:  ?   General: No focal deficit present.  ?   Mental Status: He is alert and oriented  to person, place, and time.  ?Psychiatric:     ?   Mood and Affect: Mood normal.     ?   Behavior: Behavior normal.     ?   Thought Content: Thought content normal.     ?   Judgment: Judgment normal.  ? ? ? ?UC Treatments / Results  ?Labs ?(all labs ordered are listed, but only abnormal results are displayed) ?Labs Reviewed  ?URINALYSIS, ROUTINE W REFLEX MICROSCOPIC - Abnormal; Notable for the following components:  ?    Result Value  ? APPearance HAZY (*)   ? Hgb urine dipstick TRACE (*)   ? Nitrite POSITIVE (*)   ? Leukocytes,Ua MODERATE (*)   ? All other components within normal limits  ?URINALYSIS, MICROSCOPIC (REFLEX) - Abnormal; Notable for the following components:  ? Bacteria, UA MANY (*)   ? All other components within normal limits  ?URINE CULTURE  ? ? ?EKG ? ? ?Radiology ?No results found. ? ?Procedures ?Procedures (including critical care time) ? ?Medications Ordered in UC ?Medications - No data to display ? ?Initial Impression / Assessment and Plan / UC Course  ?I have reviewed the triage vital signs and the nursing notes. ? ?Pertinent labs & imaging results that were available during my care of the patient were reviewed by me and considered in my medical decision making (see chart for details). ? ?Is a nontoxic-appearing 71 year old male here for evaluation of urinary complaints as outlined in HPI above.  He has a history of frequent UTIs with his most recent one being on 06/29/2021 demonstrated with a 7-day course of Cipro.  UTI symptoms returned this afternoon.  His physical exam reveals a benign cardiopulmonary exam with S1-S2 heart sounds that are regular rate and rhythm and lung sounds that are clear to auscultation all fields.  No CVA tenderness on exam.  Urine was collected at triage and is pending. ? ?UA shows hazy appearance with trace hemoglobin, nitrate positive, and moderate leukocytes.  No protein.  Microscopic shows 21-50 WBCs and many bacteria.  We will send urine for culture. ? ?Given  that patient has had recurrent UTIs and fluoroquinolones have been the antibiotic necessary to achieve a response I will discharge patient home on Cipro twice daily for 10 days.  I have also provided Pyridium to help with urinary discomfort but he states that he does not need that.  I have advised him that he can just leave it at the pharmacy then.  I have also suggested that he increase his oral fluid intake so that increases his urine production and flushes his urinary tract.  We will culture the urine and call him if we need to make any adjustments to the antibiotics based on the results. ? ? ?Final Clinical Impressions(s) / UC Diagnoses  ? ?Final diagnoses:  ?Acute cystitis without hematuria  ? ? ? ?Discharge Instructions   ? ?  ?Take the Cipro twice daily for 10 days with food for treatment of urinary tract infection. ? ?Use the Pyridium every 8 hours as needed for urinary discomfort.  This will turn your urine a bright red-orange. ? ?Increase your oral fluid intake so that you increase your urine production and or flushing your urinary system. ? ?Take an over-the-counter probiotic, such as Culturelle-Align-Activia, 1 hour after each dose of antibiotic to prevent diarrhea or yeast infections from forming. ? ?We will culture urine and change the antibiotics if necessary. ? ?Return for reevaluation, or see your primary care provider, for any new or worsening symptoms.  ? ? ? ? ?ED Prescriptions   ? ? Medication Sig Dispense Auth. Provider  ? ciprofloxacin (CIPRO) 500 MG tablet Take 1 tablet (500 mg total) by mouth 2 (two) times daily for 10 days. 20 tablet Margarette Canada, NP  ? phenazopyridine (PYRIDIUM) 200 MG tablet Take 1 tablet (200 mg total) by mouth 3 (three) times daily. 6 tablet Margarette Canada, NP  ? ?  ? ?PDMP not reviewed this encounter. ?  ?Margarette Canada, NP ?07/11/21 1925 ? ?

## 2021-07-11 NOTE — ED Triage Notes (Signed)
Patient presents to Urgent Care with complaints of possible UTI. C/o of cloudy urine and dysuria today. Has a hx of UTIs. Pt was treated with UTI 03/08. Was prescribed cipro.  ? ?Denies fever or hematuria.  ?

## 2021-07-11 NOTE — Discharge Instructions (Signed)
Take the Cipro twice daily for 10 days with food for treatment of urinary tract infection. ? ?Use the Pyridium every 8 hours as needed for urinary discomfort.  This will turn your urine a bright red-orange. ? ?Increase your oral fluid intake so that you increase your urine production and or flushing your urinary system. ? ?Take an over-the-counter probiotic, such as Culturelle-Align-Activia, 1 hour after each dose of antibiotic to prevent diarrhea or yeast infections from forming. ? ?We will culture urine and change the antibiotics if necessary. ? ?Return for reevaluation, or see your primary care provider, for any new or worsening symptoms.  ?

## 2021-07-14 LAB — URINE CULTURE: Culture: 50000 — AB

## 2022-06-01 ENCOUNTER — Ambulatory Visit: Payer: Medicare HMO | Admitting: Dermatology

## 2022-06-01 VITALS — BP 111/70

## 2022-06-01 DIAGNOSIS — L820 Inflamed seborrheic keratosis: Secondary | ICD-10-CM

## 2022-06-01 DIAGNOSIS — L219 Seborrheic dermatitis, unspecified: Secondary | ICD-10-CM | POA: Diagnosis not present

## 2022-06-01 DIAGNOSIS — L570 Actinic keratosis: Secondary | ICD-10-CM

## 2022-06-01 DIAGNOSIS — L578 Other skin changes due to chronic exposure to nonionizing radiation: Secondary | ICD-10-CM | POA: Diagnosis not present

## 2022-06-01 DIAGNOSIS — Z7189 Other specified counseling: Secondary | ICD-10-CM | POA: Diagnosis not present

## 2022-06-01 DIAGNOSIS — Z79899 Other long term (current) drug therapy: Secondary | ICD-10-CM | POA: Diagnosis not present

## 2022-06-01 MED ORDER — MOMETASONE FUROATE 0.1 % EX SOLN: CUTANEOUS | 11 refills | Status: DC

## 2022-06-01 MED ORDER — KETOCONAZOLE 2 % EX SHAM: 1.0000 | MEDICATED_SHAMPOO | CUTANEOUS | 11 refills | Status: DC

## 2022-06-01 MED ORDER — HYDROCORTISONE 2.5 % EX CREA: TOPICAL_CREAM | Freq: Two times a day (BID) | CUTANEOUS | 11 refills | Status: DC | PRN

## 2022-06-01 NOTE — Progress Notes (Signed)
Follow-Up Visit   Subjective  Juan Rivera is a 72 y.o. male who presents for the following: Other (History of AK - check sun exposed areas today per patient request). The patient has spots, moles and lesions to be evaluated, some may be new or changing and the patient has concerns that these could be cancer.  The following portions of the chart were reviewed this encounter and updated as appropriate:   Tobacco  Allergies  Meds  Problems  Med Hx  Surg Hx  Fam Hx     Review of Systems:  No other skin or systemic complaints except as noted in HPI or Assessment and Plan.  Objective  Well appearing patient in no apparent distress; mood and affect are within normal limits.  A focused examination was performed including scalp, face, arms. Relevant physical exam findings are noted in the Assessment and Plan.  Right Temple Erythematous stuck-on, waxy papule or plaque  Ears Erythematous thin papules/macules with gritty scale.    Assessment & Plan   Actinic Damage - chronic, secondary to cumulative UV radiation exposure/sun exposure over time - diffuse scaly erythematous macules with underlying dyspigmentation - Recommend daily broad spectrum sunscreen SPF 30+ to sun-exposed areas, reapply every 2 hours as needed.  - Recommend staying in the shade or wearing long sleeves, sun glasses (UVA+UVB protection) and wide brim hats (4-inch brim around the entire circumference of the hat). - Call for new or changing lesions.  Seborrheic dermatitis Scalp  Seborrheic Dermatitis  -  is a chronic persistent rash characterized by pinkness and scaling most commonly of the mid face but also can occur on the scalp (dandruff), ears; mid chest, mid back and groin.  It tends to be exacerbated by stress and cooler weather.  People who have neurologic disease may experience new onset or exacerbation of existing seborrheic dermatitis.  The condition is not curable but treatable and can be  controlled.  Continue Head and Shoulders shampoo alternate with Ketoconazole 2% shampoo Continue Mometasone lotion bid up to 5 days per week prn itch, Hydrocortisone 2.5% cream up to 3 days per week  mometasone (ELOCON) 0.1 % lotion - Scalp Apply to the scalp twice a day prn itching  hydrocortisone 2.5 % cream - Scalp Apply topically 2 (two) times daily as needed (Rash).  ketoconazole (NIZORAL) 2 % shampoo - Scalp Apply 1 Application topically 2 (two) times a week.  Inflamed seborrheic keratosis Right Temple  Destruction of lesion - Right Temple Complexity: simple   Destruction method: cryotherapy   Informed consent: discussed and consent obtained   Timeout:  patient name, date of birth, surgical site, and procedure verified Lesion destroyed using liquid nitrogen: Yes   Region frozen until ice ball extended beyond lesion: Yes   Outcome: patient tolerated procedure well with no complications   Post-procedure details: wound care instructions given    AK (actinic keratosis) Ears  Destruction of lesion - Ears Complexity: simple   Destruction method: cryotherapy   Informed consent: discussed and consent obtained   Timeout:  patient name, date of birth, surgical site, and procedure verified Lesion destroyed using liquid nitrogen: Yes   Region frozen until ice ball extended beyond lesion: Yes   Outcome: patient tolerated procedure well with no complications   Post-procedure details: wound care instructions given     Return in about 1 year (around 06/02/2023) for TBSE.  I, Ashok Cordia, CMA, am acting as scribe for Sarina Ser, MD . Documentation: I have reviewed the  above documentation for accuracy and completeness, and I agree with the above.  Sarina Ser, MD

## 2022-06-01 NOTE — Patient Instructions (Signed)
Cryotherapy Aftercare  Wash gently with soap and water everyday.   Apply Vaseline and Band-Aid daily until healed.     Due to recent changes in healthcare laws, you may see results of your pathology and/or laboratory studies on MyChart before the doctors have had a chance to review them. We understand that in some cases there may be results that are confusing or concerning to you. Please understand that not all results are received at the same time and often the doctors may need to interpret multiple results in order to provide you with the best plan of care or course of treatment. Therefore, we ask that you please give us 2 business days to thoroughly review all your results before contacting the office for clarification. Should we see a critical lab result, you will be contacted sooner.   If You Need Anything After Your Visit  If you have any questions or concerns for your doctor, please call our main line at [phone] and press option 4 to reach your doctor's medical assistant. If no one answers, please leave a voicemail as directed and we will return your call as soon as possible. Messages left after 4 pm will be answered the following business day.   You may also send us a message via MyChart. We typically respond to MyChart messages within 1-2 business days.  For prescription refills, please ask your pharmacy to contact our office. Our fax number is [phone].  If you have an urgent issue when the clinic is closed that cannot wait until the next business day, you can page your doctor at the number below.    Please note that while we do our best to be available for urgent issues outside of office hours, we are not available 24/7.   If you have an urgent issue and are unable to reach us, you may choose to seek medical care at your doctor's office, retail clinic, urgent care center, or emergency room.  If you have a medical emergency, please immediately call 911 or go to the  emergency department.  Pager Numbers  - Dr. Kowalski: [phone]  - Dr. Moye: [phone]  - Dr. Stewart: [phone]  In the event of inclement weather, please call our main line at [phone] for an update on the status of any delays or closures.  Dermatology Medication Tips: Please keep the boxes that topical medications come in in order to help keep track of the instructions about where and how to use these. Pharmacies typically print the medication instructions only on the boxes and not directly on the medication tubes.   If your medication is too expensive, please contact our office at [phone] option 4 or send us a message through MyChart.   We are unable to tell what your co-pay for medications will be in advance as this is different depending on your insurance coverage. However, we may be able to find a substitute medication at lower cost or fill out paperwork to get insurance to cover a needed medication.   If a prior authorization is required to get your medication covered by your insurance company, please allow us 1-2 business days to complete this process.  Drug prices often vary depending on where the prescription is filled and some pharmacies may offer cheaper prices.  The website www.goodrx.com contains coupons for medications through different pharmacies. The prices here do not account for what the cost may be with help from insurance (it may be cheaper with your insurance), but the website can   give you the price if you did not use any insurance.  - You can print the associated coupon and take it with your prescription to the pharmacy.  - You may also stop by our office during regular business hours and pick up a GoodRx coupon card.  - If you need your prescription sent electronically to a different pharmacy, notify our office through [facility] MyChart or by phone at [phone] option 4.     Si Usted Necesita Algo Despus de Su Visita  Tambin puede  enviarnos un mensaje a travs de MyChart. Por lo general respondemos a los mensajes de MyChart en el transcurso de 1 a 2 das hbiles.  Para renovar recetas, por favor pida a su farmacia que se ponga en contacto con nuestra oficina. Nuestro nmero de fax es el [phone].  Si tiene un asunto urgente cuando la clnica est cerrada y que no puede esperar hasta el siguiente da hbil, puede llamar/localizar a su doctor(a) al nmero que aparece a continuacin.   Por favor, tenga en cuenta que aunque hacemos todo lo posible para estar disponibles para asuntos urgentes fuera del horario de oficina, no estamos disponibles las 24 horas del da, los 7 das de la semana.   Si tiene un problema urgente y no puede comunicarse con nosotros, puede optar por buscar atencin mdica  en el consultorio de su doctor(a), en una clnica privada, en un centro de atencin urgente o en una sala de emergencias.  Si tiene una emergencia mdica, por favor llame inmediatamente al 911 o vaya a la sala de emergencias.  Nmeros de bper  - Dr. Kowalski: [phone]  - Dra. Moye: [phone]  - Dra. Stewart: [phone]  En caso de inclemencias del tiempo, por favor llame a nuestra lnea principal al [phone] para una actualizacin sobre el estado de cualquier retraso o cierre.  Consejos para la medicacin en dermatologa: Por favor, guarde las cajas en las que vienen los medicamentos de uso tpico para ayudarle a seguir las instrucciones sobre dnde y cmo usarlos. Las farmacias generalmente imprimen las instrucciones del medicamento slo en las cajas y no directamente en los tubos del medicamento.   Si su medicamento es muy caro, por favor, pngase en contacto con nuestra oficina llamando al [phone] y presione la opcin 4 o envenos un mensaje a travs de MyChart.   No podemos decirle cul ser su copago por los medicamentos por adelantado ya que esto es diferente dependiendo de la cobertura de su seguro.  Sin embargo, es posible que podamos encontrar un medicamento sustituto a menor costo o llenar un formulario para que el seguro cubra el medicamento que se considera necesario.   Si se requiere una autorizacin previa para que su compaa de seguros cubra su medicamento, por favor permtanos de 1 a 2 das hbiles para completar este proceso.  Los precios de los medicamentos varan con frecuencia dependiendo del lugar de dnde se surte la receta y alguna farmacias pueden ofrecer precios ms baratos.  El sitio web www.goodrx.com tiene cupones para medicamentos de diferentes farmacias. Los precios aqu no tienen en cuenta lo que podra costar con la ayuda del seguro (puede ser ms barato con su seguro), pero el sitio web puede darle el precio si no utiliz ningn seguro.  - Puede imprimir el cupn correspondiente y llevarlo con su receta a la farmacia.  - Tambin puede pasar por nuestra oficina durante el horario de atencin regular y recoger una tarjeta de cupones de GoodRx.  -   Si necesita que su receta se enve electrnicamente a una farmacia diferente, informe a nuestra oficina a travs de MyChart de [facility] o por telfono llamando al [phone] y presione la opcin 4.  

## 2022-06-13 ENCOUNTER — Encounter: Payer: Self-pay | Admitting: Dermatology

## 2022-06-27 ENCOUNTER — Other Ambulatory Visit: Payer: Self-pay | Admitting: Orthopedic Surgery

## 2022-06-27 DIAGNOSIS — M19012 Primary osteoarthritis, left shoulder: Secondary | ICD-10-CM

## 2022-07-10 ENCOUNTER — Ambulatory Visit
Admission: RE | Admit: 2022-07-10 | Discharge: 2022-07-10 | Disposition: A | Discharge status: Home / Self Care | Payer: Medicare HMO | Source: Ambulatory Visit | Attending: Orthopedic Surgery | Admitting: Orthopedic Surgery

## 2022-07-10 DIAGNOSIS — M19012 Primary osteoarthritis, left shoulder: Secondary | ICD-10-CM

## 2023-03-09 DIAGNOSIS — M1711 Unilateral primary osteoarthritis, right knee: Secondary | ICD-10-CM | POA: Insufficient documentation

## 2023-06-07 ENCOUNTER — Ambulatory Visit: Payer: Medicare HMO | Admitting: Dermatology

## 2023-06-07 DIAGNOSIS — M12571 Traumatic arthropathy, right ankle and foot: Secondary | ICD-10-CM | POA: Insufficient documentation

## 2023-06-13 ENCOUNTER — Other Ambulatory Visit: Payer: Self-pay | Admitting: Dermatology

## 2023-06-13 DIAGNOSIS — L219 Seborrheic dermatitis, unspecified: Secondary | ICD-10-CM

## 2023-06-18 ENCOUNTER — Ambulatory Visit: Payer: Medicare HMO | Admitting: Dermatology

## 2023-06-18 ENCOUNTER — Encounter: Payer: Self-pay | Admitting: Dermatology

## 2023-06-18 DIAGNOSIS — L219 Seborrheic dermatitis, unspecified: Secondary | ICD-10-CM

## 2023-06-18 DIAGNOSIS — L820 Inflamed seborrheic keratosis: Secondary | ICD-10-CM

## 2023-06-18 DIAGNOSIS — L570 Actinic keratosis: Secondary | ICD-10-CM | POA: Diagnosis not present

## 2023-06-18 DIAGNOSIS — D1801 Hemangioma of skin and subcutaneous tissue: Secondary | ICD-10-CM

## 2023-06-18 DIAGNOSIS — L821 Other seborrheic keratosis: Secondary | ICD-10-CM

## 2023-06-18 DIAGNOSIS — Z1283 Encounter for screening for malignant neoplasm of skin: Secondary | ICD-10-CM | POA: Diagnosis not present

## 2023-06-18 DIAGNOSIS — L814 Other melanin hyperpigmentation: Secondary | ICD-10-CM

## 2023-06-18 DIAGNOSIS — L578 Other skin changes due to chronic exposure to nonionizing radiation: Secondary | ICD-10-CM | POA: Diagnosis not present

## 2023-06-18 DIAGNOSIS — Z7189 Other specified counseling: Secondary | ICD-10-CM

## 2023-06-18 DIAGNOSIS — Z5111 Encounter for antineoplastic chemotherapy: Secondary | ICD-10-CM

## 2023-06-18 DIAGNOSIS — Z79899 Other long term (current) drug therapy: Secondary | ICD-10-CM

## 2023-06-18 DIAGNOSIS — W908XXA Exposure to other nonionizing radiation, initial encounter: Secondary | ICD-10-CM | POA: Diagnosis not present

## 2023-06-18 DIAGNOSIS — L603 Nail dystrophy: Secondary | ICD-10-CM

## 2023-06-18 DIAGNOSIS — L918 Other hypertrophic disorders of the skin: Secondary | ICD-10-CM

## 2023-06-18 DIAGNOSIS — L299 Pruritus, unspecified: Secondary | ICD-10-CM

## 2023-06-18 DIAGNOSIS — D229 Melanocytic nevi, unspecified: Secondary | ICD-10-CM

## 2023-06-18 MED ORDER — FLUOROURACIL 5 % EX CREA: TOPICAL_CREAM | CUTANEOUS | 0 refills | Status: AC

## 2023-06-18 MED ORDER — MOMETASONE FUROATE 0.1 % EX SOLN: CUTANEOUS | 11 refills | Status: AC

## 2023-06-18 NOTE — Patient Instructions (Addendum)
Instructions for [facility] Medications  One or more of your medications was sent to the [facility] mail order compounding pharmacy. You will receive an email from them and can purchase the medicine through that link. It will then be mailed to your home at the address you confirmed. If for any reason you do not receive an email from them, please check your spam folder. If you still do not find the email, please let us know. [facility] phone number is [phone].  5-Fluorouracil/Calcipotriene Patient Education   Actinic keratoses are the dry, red scaly spots on the skin caused by sun damage. A portion of these spots can turn into skin cancer with time, and treating them can help prevent development of skin cancer.   Treatment of these spots requires removal of the defective skin cells. There are various ways to remove actinic keratoses, including freezing with liquid nitrogen, treatment with creams, or treatment with a blue light procedure in the office.   5-fluorouracil cream is a topical cream used to treat actinic keratoses. It works by interfering with the growth of abnormal fast-growing skin cells, such as actinic keratoses. These cells peel off and are replaced by healthy ones. THIS CREAM SHOULD BE KEPT OUT OF REACH OF CHILDREN AND PETS AND SHOULD NOT BE USED BY PREGNANT WOMEN.  5-fluorouracil/calcipotriene is a combination of the 5-fluorouracil cream with a vitamin D analog cream called calcipotriene. The calcipotriene alone does not treat actinic keratoses. However, when it is combined with 5-fluorouracil, it helps the 5-fluorouracil treat the actinic keratoses much faster so that the same results can be achieved with a much shorter treatment time.  INSTRUCTIONS FOR 5-FLUOROURACIL/CALCIPOTRIENE CREAM:   5-fluorouracil/calcipotriene cream typically only needs to be used for 4-7 days. A thin layer should be applied twice a day to the treatment areas recommended by your  physician.   If your physician prescribed you separate tubes of 5-fluourouracil and calcipotriene, apply a thin layer of 5-fluorouracil followed by a thin layer of calcipotriene.   Avoid contact with your eyes or nostrils. Avoid applying the cream to your eyelids or lips unless directed to apply there by your physician. Do not use 5-fluorouracil/calcipotriene cream on infected or open wounds.   You will develop redness, irritation and some crusting at areas where you have pre-cancer damage/actinic keratoses. IF YOU DEVELOP PAIN, BLEEDING, OR SIGNIFICANT CRUSTING, STOP THE TREATMENT EARLY - you have already gotten a good response and the actinic keratoses should clear up well.  Wash your hands after applying 5-fluorouracil 5% cream on your skin.   A moisturizer or sunscreen with a minimum SPF 30 should be applied each morning.   Once you have finished the treatment, you can apply a thin layer of Vaseline twice a day to irritated areas to soothe and calm the areas more quickly. If you experience significant discomfort, contact your physician.  For some patients it is necessary to repeat the treatment for best results.  SIDE EFFECTS: When using 5-fluorouracil/calcipotriene cream, you may have mild irritation, such as redness, dryness, swelling, or a mild burning sensation. This usually resolves within 2 weeks. The more actinic keratoses you have, the more redness and inflammation you can expect during treatment. Eye irritation has been reported rarely. If this occurs, please let us know.   If you have any trouble using this cream, please send [place] a MyChart message or call the office. If you have any other questions about this information, please do not hesitate to ask me before  you leave the office or contact me on MyChart or by phone.    Due to recent changes in healthcare laws, you may see results of your pathology and/or laboratory studies on MyChart before the doctors have had a chance to  review them. We understand that in some cases there may be results that are confusing or concerning to you. Please understand that not all results are received at the same time and often the doctors may need to interpret multiple results in order to provide you with the best plan of care or course of treatment. Therefore, we ask that you please give [place] 2 business days to thoroughly review all your results before contacting the office for clarification. Should we see a critical lab result, you will be contacted sooner.   If You Need Anything After Your Visit  If you have any questions or concerns for your doctor, please call our main line at [phone] and press option 4 to reach your doctor's medical assistant. If no one answers, please leave a voicemail as directed and we will return your call as soon as possible. Messages left after 4 pm will be answered the following business day.   You may also send [place] a message via MyChart. We typically respond to MyChart messages within 1-2 business days.  For prescription refills, please ask your pharmacy to contact our office. Our fax number is [phone].  If you have an urgent issue when the clinic is closed that cannot wait until the next business day, you can page your doctor at the number below.    Please note that while we do our best to be available for urgent issues outside of office hours, we are not available 24/7.   If you have an urgent issue and are unable to reach [place], you may choose to seek medical care at your doctor's office, retail clinic, urgent care center, or emergency room.  If you have a medical emergency, please immediately call 911 or go to the emergency department.  Pager Numbers  - Dr. Gwen Pounds: [phone]  - Dr. Roseanne Reno: [phone]  - Dr. Katrinka Blazing: [phone]   In the event of inclement weather, please call our main line at [phone] for an update on the status of any delays or closures.  Dermatology  Medication Tips: Please keep the boxes that topical medications come in in order to help keep track of the instructions about where and how to use these. Pharmacies typically print the medication instructions only on the boxes and not directly on the medication tubes.   If your medication is too expensive, please contact our office at [phone] option 4 or send [place] a message through MyChart.   We are unable to tell what your co-pay for medications will be in advance as this is different depending on your insurance coverage. However, we may be able to find a substitute medication at lower cost or fill out paperwork to get insurance to cover a needed medication.   If a prior authorization is required to get your medication covered by your insurance company, please allow [place] 1-2 business days to complete this process.  Drug prices often vary depending on where the prescription is filled and some pharmacies may offer cheaper prices.  The website www.goodrx.com contains coupons for medications through different pharmacies. The prices here do not account for what the cost may be with help from insurance (it may be cheaper with your insurance), but the website can give you the price if you  did not use any insurance.  - You can print the associated coupon and take it with your prescription to the pharmacy.  - You may also stop by our office during regular business hours and pick up a GoodRx coupon card.  - If you need your prescription sent electronically to a different pharmacy, notify our office through [facility] or by phone at [phone] option 4.     Si Usted Necesita Algo Despus de Su Visita  Tambin puede enviarnos un mensaje a travs de Clinical cytogeneticist. Por lo general respondemos a los mensajes de MyChart en el transcurso de 1 a 2 das hbiles.  Para renovar recetas, por favor pida a su farmacia que se ponga en contacto con nuestra oficina. Annie Sable de fax es [place] [phone].  Si  tiene un asunto urgente cuando la clnica est cerrada y que no puede esperar hasta el siguiente da hbil, puede llamar/localizar a su doctor(a) al nmero que aparece a continuacin.   Por favor, tenga en cuenta que aunque hacemos todo lo posible para estar disponibles para asuntos urgentes fuera del horario de [place], no estamos disponibles las 24 horas del da, los 7 [address]  [address] de la [place].   Si tiene un problema urgente y no puede comunicarse con nosotros, puede optar por buscar atencin mdica  en el consultorio de su doctor(a), en una clnica privada, en un centro de atencin urgente o en una sala de emergencias.  Si tiene Engineer, drilling, por favor llame inmediatamente al 911 o vaya a la sala de emergencias.  Nmeros de bper  - Dr. Gwen Pounds: [phone]  - Dra. Roseanne Reno: [phone]  - Dr. Katrinka Blazing: [phone]   En caso de inclemencias del tiempo, por favor llame a Lacy Duverney principal al [phone] para una actualizacin sobre el [place] de cualquier retraso o cierre.  Consejos para la medicacin en dermatologa: Por favor, guarde las cajas en las que vienen los medicamentos de uso tpico para ayudarle a seguir las instrucciones sobre dnde y cmo usarlos. Las farmacias generalmente imprimen las instrucciones del medicamento slo en las cajas y no directamente en los tubos del [place].   Si su medicamento es muy caro, por favor, pngase en contacto con Rolm Gala llamando al [phone] y presione la opcin 4 o envenos un mensaje a travs de Clinical cytogeneticist.   No podemos decirle cul ser su copago por los medicamentos por adelantado ya que esto es diferente dependiendo de la cobertura de su seguro. Sin embargo, es posible que podamos encontrar un medicamento sustituto a Audiological scientist un formulario para que el seguro cubra el medicamento que se considera necesario.   Si se requiere una autorizacin previa para que su compaa de seguros [place] su medicamento, por  favor permtanos de 1 a 2 das hbiles para completar [address]  Los precios de los medicamentos varan con frecuencia dependiendo del Environmental consultant de dnde se surte la receta y alguna farmacias pueden ofrecer precios ms baratos.  El sitio web www.goodrx.com tiene cupones para medicamentos de Health and safety inspector. Los precios aqu no tienen en cuenta lo que podra costar con la ayuda del seguro (puede ser ms barato con su seguro), pero el sitio web puede darle el precio si no utiliz Tourist information centre manager.  - Puede imprimir el cupn correspondiente y llevarlo con su receta a la farmacia.  - Tambin puede pasar por nuestra oficina durante el horario de atencin regular y Education officer, museum una tarjeta de cupones de GoodRx.  - Si necesita que su receta  se enve electrnicamente a una farmacia diferente, informe a nuestra oficina a travs de MyChart de [place] o por telfono llamando al [phone] y presione la opcin 4.

## 2023-06-18 NOTE — Progress Notes (Signed)
 Follow-Up Visit   Subjective  Juan Rivera is a 73 y.o. male who presents for the following: Skin Cancer Screening and Upper Body Skin Exam  The patient presents for Upper Body Skin Exam (UBSE) for skin cancer screening and mole check. The patient has spots, moles and lesions to be evaluated, some may be new or changing and the patient may have concern these could be cancer.  Patient has a rash on lower legs and L upper arm that he is concerned may be "ring worm". Patient has tried HC cream, Lamisil, and Lotrimin cream. Lotrimin cream has helped improve patches. Pt concerned about fungus on the toenails and he would like to discuss treatment options.  The following portions of the chart were reviewed this encounter and updated as appropriate: medications, allergies, medical history  Review of Systems:  No other skin or systemic complaints except as noted in HPI or Assessment and Plan.  Objective  Well appearing patient in no apparent distress; mood and affect are within normal limits.  All skin waist up, the lower legs, feet, and toenails. Relevant physical exam findings are noted in the Assessment and Plan.  L ear x 1 Erythematous thin papules/macules with gritty scale.  Back x 2, L mid lat tricep x 2, R mid calf x 1, R lat neck x 1, L lat neck x 2 (8) Erythematous stuck-on, waxy papule or plaque  Assessment & Plan   AK (ACTINIC KERATOSIS) L ear x 1 ACTINIC DAMAGE WITH PRECANCEROUS ACTINIC KERATOSES Counseling for Topical Chemotherapy Management: Patient exhibits: - Severe, confluent actinic changes with pre-cancerous actinic keratoses that is secondary to cumulative UV radiation exposure over time - Condition that is severe; chronic, not at goal. - diffuse scaly erythematous macules and papules with underlying dyspigmentation - Discussed Prescription "Field Treatment" topical Chemotherapy for Severe, Chronic Confluent Actinic Changes with Pre-Cancerous Actinic  Keratoses Field treatment involves treatment of an entire area of skin that has confluent Actinic Changes (Sun/ Ultraviolet light damage) and PreCancerous Actinic Keratoses by method of PhotoDynamic Therapy (PDT) and/or prescription Topical Chemotherapy agents such as 5-fluorouracil, 5-fluorouracil/calcipotriene, and/or imiquimod.  The purpose is to decrease the number of clinically evident and subclinical PreCancerous lesions to prevent progression to development of skin cancer by chemically destroying early precancer changes that may or may not be visible.  It has been shown to reduce the risk of developing skin cancer in the treated area. As a result of treatment, redness, scaling, crusting, and open sores may occur during treatment course. One or more than one of these methods may be used and may have to be used several times to control, suppress and eliminate the PreCancerous changes. Discussed treatment course, expected reaction, and possible side effects. - Recommend daily broad spectrum sunscreen SPF 30+ to sun-exposed areas, reapply every 2 hours as needed.  - Staying in the shade or wearing long sleeves, sun glasses (UVA+UVB protection) and wide brim hats (4-inch brim around the entire circumference of the hat) are also recommended. - Call for new or changing lesions.  Start 5-FU/calcipotriene cream twice daily for 7 days to affected area left ear starting in 1 month  Actinic keratoses are precancerous spots that appear secondary to cumulative UV radiation exposure/sun exposure over time. They are chronic with expected duration over 1 year. A portion of actinic keratoses will progress to squamous cell carcinoma of the skin. It is not possible to reliably predict which spots will progress to skin cancer and so treatment is recommended  to prevent development of skin cancer.  Recommend daily broad spectrum sunscreen SPF 30+ to sun-exposed areas, reapply every 2 hours as needed.  Recommend staying  in the shade or wearing long sleeves, sun glasses (UVA+UVB protection) and wide brim hats (4-inch brim around the entire circumference of the hat). Call for new or changing lesions. - Start Fluorouracil mix in one month apply to aa L ear BID x 1 week.  Destruction of lesion - L ear x 1 Complexity: simple   Destruction method: cryotherapy   Informed consent: discussed and consent obtained   Timeout:  patient name, date of birth, surgical site, and procedure verified Lesion destroyed using liquid nitrogen: Yes   Region frozen until ice ball extended beyond lesion: Yes   Outcome: patient tolerated procedure well with no complications   Post-procedure details: wound care instructions given   INFLAMED SEBORRHEIC KERATOSIS (8) Back x 2, L mid lat tricep x 2, R mid calf x 1, R lat neck x 1, L lat neck x 2 (8) Symptomatic, irritating, patient would like treated.  Destruction of lesion - Back x 2, L mid lat tricep x 2, R mid calf x 1, R lat neck x 1, L lat neck x 2 (8) Complexity: simple   Destruction method: cryotherapy   Informed consent: discussed and consent obtained   Timeout:  patient name, date of birth, surgical site, and procedure verified Lesion destroyed using liquid nitrogen: Yes   Region frozen until ice ball extended beyond lesion: Yes   Outcome: patient tolerated procedure well with no complications   Post-procedure details: wound care instructions given   Skin cancer screening performed today.  Actinic Damage - Chronic condition, secondary to cumulative UV/sun exposure - diffuse scaly erythematous macules with underlying dyspigmentation - Recommend daily broad spectrum sunscreen SPF 30+ to sun-exposed areas, reapply every 2 hours as needed.  - Staying in the shade or wearing long sleeves, sun glasses (UVA+UVB protection) and wide brim hats (4-inch brim around the entire circumference of the hat) are also recommended for sun protection.  - Call for new or changing  lesions.  Lentigines, Seborrheic Keratoses, Hemangiomas - Benign normal skin lesions - Benign-appearing - Call for any changes  Melanocytic Nevi - Tan-brown and/or pink-flesh-colored symmetric macules and papules - Benign appearing on exam today - Observation - Call clinic for new or changing moles - Recommend daily use of broad spectrum spf 30+ sunscreen to sun-exposed areas.   Seborrheic dermatitis With Pruritus Scalp - clear today Seborrheic Dermatitis  -  is a chronic persistent rash characterized by pinkness and scaling most commonly of the mid face but also can occur on the scalp (dandruff), ears; mid chest, mid back and groin.  It tends to be exacerbated by stress and cooler weather.  People who have neurologic disease may experience new onset or exacerbation of existing seborrheic dermatitis.  The condition is not curable but treatable and can be controlled.  Chronic and persistent condition with duration or expected duration over one year. Condition is improving with treatment but not currently at goal. Still some itching.  Continue Head and Shoulders shampoo alternate with Ketoconazole 2% shampoo Continue Mometasone lotion bid up to 5 days per week prn itch, Hydrocortisone 2.5% cream up to 3 days per week  Continue Ketoconazole 2% shampoo alternating with Head and Shoulders every other night. Continue HC 2.5% cream to aa's every other day PRN - may use up to 5 days per week.  Acrochordons (Skin Tags) - axillary -  Fleshy, skin-colored pedunculated papules - Benign appearing.  - Observe. - If desired, they can be removed with an in office procedure that is not covered by insurance. - Please call the clinic if you notice any new or changing lesions.  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 Consider molecular testing if patient continues to have  issues.  If worsens, will re-evaluate and consider Molecular studies.  Hx torn prick reaction with pustules from plan stickers in past (now resolved) -  Recommend antibiotic ointment to infected areas until healed.  Make sure get medical attention for problem if not improving or if worsening.  Return in about 1 year (around 06/17/2024) for TBSE.  Maylene Roes, CMA, am acting as scribe for Armida Sans, MD .   Documentation: I have reviewed the above documentation for accuracy and completeness, and I agree with the above.  Armida Sans, MD

## 2023-06-21 ENCOUNTER — Ambulatory Visit: Payer: Medicare HMO | Admitting: Dermatology

## 2023-07-19 ENCOUNTER — Encounter: Payer: Self-pay | Admitting: Dermatology

## 2023-10-17 DIAGNOSIS — R600 Localized edema: Secondary | ICD-10-CM | POA: Insufficient documentation

## 2023-10-18 DIAGNOSIS — S9301XA Subluxation of right ankle joint, initial encounter: Secondary | ICD-10-CM | POA: Insufficient documentation

## 2023-10-18 DIAGNOSIS — I493 Ventricular premature depolarization: Secondary | ICD-10-CM | POA: Insufficient documentation

## 2023-10-24 ENCOUNTER — Other Ambulatory Visit: Payer: Self-pay | Admitting: Dermatology

## 2023-10-24 DIAGNOSIS — L219 Seborrheic dermatitis, unspecified: Secondary | ICD-10-CM

## 2023-11-27 ENCOUNTER — Ambulatory Visit: Admitting: Dermatology

## 2023-12-10 ENCOUNTER — Encounter: Payer: Self-pay | Admitting: Dermatology

## 2023-12-10 ENCOUNTER — Ambulatory Visit: Admitting: Dermatology

## 2023-12-10 DIAGNOSIS — W908XXA Exposure to other nonionizing radiation, initial encounter: Secondary | ICD-10-CM

## 2023-12-10 DIAGNOSIS — Z1283 Encounter for screening for malignant neoplasm of skin: Secondary | ICD-10-CM | POA: Diagnosis not present

## 2023-12-10 DIAGNOSIS — L570 Actinic keratosis: Secondary | ICD-10-CM

## 2023-12-10 DIAGNOSIS — D1801 Hemangioma of skin and subcutaneous tissue: Secondary | ICD-10-CM

## 2023-12-10 DIAGNOSIS — L578 Other skin changes due to chronic exposure to nonionizing radiation: Secondary | ICD-10-CM | POA: Diagnosis not present

## 2023-12-10 DIAGNOSIS — L814 Other melanin hyperpigmentation: Secondary | ICD-10-CM | POA: Diagnosis not present

## 2023-12-10 DIAGNOSIS — L853 Xerosis cutis: Secondary | ICD-10-CM

## 2023-12-10 DIAGNOSIS — L821 Other seborrheic keratosis: Secondary | ICD-10-CM

## 2023-12-10 DIAGNOSIS — D229 Melanocytic nevi, unspecified: Secondary | ICD-10-CM

## 2023-12-10 NOTE — Progress Notes (Signed)
   Follow-Up Visit   Subjective  Juan Rivera is a 73 y.o. male who presents for the following: Skin Cancer Screening and Upper Body Skin Exam hx of Aks, pt did use 5FU/Calcipotriene to bil ears with good reaction, itching on and off lower legs for yrs, dry irritated spot penis, has tried Cerave cream and it burns area  The patient presents for Upper Body Skin Exam (UBSE) for skin cancer screening and mole check. The patient has spots, moles and lesions to be evaluated, some may be new or changing and the patient may have concern these could be cancer.    The following portions of the chart were reviewed this encounter and updated as appropriate: medications, allergies, medical history  Review of Systems:  No other skin or systemic complaints except as noted in HPI or Assessment and Plan.  Objective  Well appearing patient in no apparent distress; mood and affect are within normal limits.  All skin waist up examined. Relevant physical exam findings are noted in the Assessment and Plan.    Assessment & Plan   MULTIPLE BENIGN NEVI   LENTIGINES   ACTINIC ELASTOSIS   SEBORRHEIC KERATOSES   CHERRY ANGIOMA   XEROSIS CUTIS   Skin cancer screening performed today.  Actinic Damage - Chronic condition, secondary to cumulative UV/sun exposure - diffuse scaly erythematous macules with underlying dyspigmentation - Recommend daily broad spectrum sunscreen SPF 30+ to sun-exposed areas, reapply every 2 hours as needed.  - Staying in the shade or wearing long sleeves, sun glasses (UVA+UVB protection) and wide brim hats (4-inch brim around the entire circumference of the hat) are also recommended for sun protection.  - Call for new or changing lesions.  Lentigines, Seborrheic Keratoses, Hemangiomas - Benign normal skin lesions - Benign-appearing - Call for any changes - Sks face, L dorsum hand Melanocytic Nevi - Tan-brown and/or pink-flesh-colored symmetric macules and  papules - Benign appearing on exam today - Observation - Call clinic for new or changing moles - Recommend daily use of broad spectrum spf 30+ sunscreen to sun-exposed areas.   Xerosis Lower legs - diffuse xerotic patches - recommend gentle, hydrating skin care - gentle skin care handout given - Increase Cerave cream to tid to lower legs  AKs, improving but still some lesions present Forehead, frontal hairline Exam: pink scaly macules  Treatment Plan: Start 5FU/Calcipotriene cr bid for 7 days to forehead/hairline  XEROSIS vs OTHER Penis Exam: pt declines exam today  Treatment Plan: Recommend Aquaphor oint or Vanicream oint prn flares   Return for as scheduled for TBSE , Hx of AKs.  I, Grayce Saunas, RMA, am acting as scribe for Boneta Sharps, MD .   Documentation: I have reviewed the above documentation for accuracy and completeness, and I agree with the above.  Boneta Sharps, MD

## 2023-12-10 NOTE — Patient Instructions (Addendum)
 Aks (pre cancers) forehead into hairline Start Fluorouracil  5%/Calcipotriene cream twice a day for 7 days    Due to recent changes in healthcare laws, you may see results of your pathology and/or laboratory studies on MyChart before the doctors have had a chance to review them. We understand that in some cases there may be results that are confusing or concerning to you. Please understand that not all results are received at the same time and often the doctors may need to interpret multiple results in order to provide you with the best plan of care or course of treatment. Therefore, we ask that you please give us  2 business days to thoroughly review all your results before contacting the office for clarification. Should we see a critical lab result, you will be contacted sooner.   If You Need Anything After Your Visit  If you have any questions or concerns for your doctor, please call our main line at [phone] and press option 4 to reach your doctor's medical assistant. If no one answers, please leave a voicemail as directed and we will return your call as soon as possible. Messages left after 4 pm will be answered the following business day.   You may also send us  a message via MyChart. We typically respond to MyChart messages within 1-2 business days.  For prescription refills, please ask your pharmacy to contact our office. Our fax number is [phone].  If you have an urgent issue when the clinic is closed that cannot wait until the next business day, you can page your doctor at the number below.    Please note that while we do our best to be available for urgent issues outside of office hours, we are not available 24/7.   If you have an urgent issue and are unable to reach us , you may choose to seek medical care at your doctor's office, retail clinic, urgent care center, or emergency room.  If you have a medical emergency, please immediately call 911 or go to the emergency  department.  Pager Numbers  - Dr. Hester: [phone]  - Dr. Jackquline: [phone]  - Dr. Claudene: [phone]   In the event of inclement weather, please call our main line at [phone] for an update on the status of any delays or closures.  Dermatology Medication Tips: Please keep the boxes that topical medications come in in order to help keep track of the instructions about where and how to use these. Pharmacies typically print the medication instructions only on the boxes and not directly on the medication tubes.   If your medication is too expensive, please contact our office at [phone] option 4 or send us  a message through MyChart.   We are unable to tell what your co-pay for medications will be in advance as this is different depending on your insurance coverage. However, we may be able to find a substitute medication at lower cost or fill out paperwork to get insurance to cover a needed medication.   If a prior authorization is required to get your medication covered by your insurance company, please allow us  1-2 business days to complete this process.  Drug prices often vary depending on where the prescription is filled and some pharmacies may offer cheaper prices.  The website www.goodrx.com contains coupons for medications through different pharmacies. The prices here do not account for what the cost may be with help from insurance (it may be cheaper with your insurance), but the website can give you the  price if you did not use any insurance.  - You can print the associated coupon and take it with your prescription to the pharmacy.  - You may also stop by our office during regular business hours and pick up a GoodRx coupon card.  - If you need your prescription sent electronically to a different pharmacy, notify our office through [facility] or by phone at [phone] option 4.     Si Usted Necesita Algo Despus de Su Visita  Tambin puede enviarnos  un mensaje a travs de Clinical cytogeneticist. Por lo general respondemos a los mensajes de MyChart en el transcurso de 1 a 2 das hbiles.  Para renovar recetas, por favor pida a su farmacia que se ponga en contacto con nuestra oficina. Randi lakes de fax es [place] [phone].  Si tiene un asunto urgente cuando la clnica est cerrada y que no puede esperar hasta el siguiente da hbil, puede llamar/localizar a su doctor(a) al nmero que aparece a continuacin.   Por favor, tenga en cuenta que aunque hacemos todo lo posible para estar disponibles para asuntos urgentes fuera del horario de [place], no estamos disponibles las 24 horas del da, los 7 [address]  [address] de la [place].   Si tiene un problema urgente y no puede comunicarse con nosotros, puede optar por buscar atencin mdica  en el consultorio de su doctor(a), en una clnica privada, en un centro de atencin urgente o en una sala de emergencias.  Si tiene Engineer, drilling, por favor llame inmediatamente al 911 o vaya a la sala de emergencias.  Nmeros de bper  - Dr. Hester: [phone]  - Dra. Jackquline: [phone]  - Dr. Claudene: [phone]   En caso de inclemencias del tiempo, por favor llame a landry capes principal al [phone] para una actualizacin sobre el [place] de cualquier retraso o cierre.  Consejos para la medicacin en dermatologa: Por favor, guarde las cajas en las que vienen los medicamentos de uso tpico para ayudarle a seguir las instrucciones sobre dnde y cmo usarlos. Las farmacias generalmente imprimen las instrucciones del medicamento slo en las cajas y no directamente en los tubos del [place].   Si su medicamento es muy caro, por favor, pngase en contacto con landry rieger llamando al [phone] y presione la opcin 4 o envenos un mensaje a travs de Clinical cytogeneticist.   No podemos decirle cul ser su copago por los medicamentos por adelantado ya que esto es diferente dependiendo de la cobertura de su seguro. Sin  embargo, es posible que podamos encontrar un medicamento sustituto a Audiological scientist un formulario para que el seguro cubra el medicamento que se considera necesario.   Si se requiere una autorizacin previa para que su compaa de seguros malta su medicamento, por favor permtanos de 1 a 2 das hbiles para completar este proceso.  Los precios de los medicamentos varan con frecuencia dependiendo del Environmental consultant de dnde se surte la receta y alguna farmacias pueden ofrecer precios ms baratos.  El sitio web www.goodrx.com tiene cupones para medicamentos de Health and safety inspector. Los precios aqu no tienen en cuenta lo que podra costar con la ayuda del seguro (puede ser ms barato con su seguro), pero el sitio web puede darle el precio si no utiliz Tourist information centre manager.  - Puede imprimir el cupn correspondiente y llevarlo con su receta a la farmacia.  - Tambin puede pasar por nuestra oficina durante el horario de atencin regular y Education officer, museum una tarjeta de cupones de GoodRx.  - Si necesita  que su receta se enve electrnicamente a Psychiatrist, informe a nuestra oficina a travs de MyChart de [place] o por telfono llamando al [phone] y presione la opcin 4.

## 2024-06-18 ENCOUNTER — Ambulatory Visit: Payer: Medicare HMO | Admitting: Dermatology
